# Patient Record
Sex: Female | Born: 1970 | Race: White | Hispanic: No | Smoking: Current every day smoker
Health system: Southern US, Community
[De-identification: ages and names within clinical notes are randomized; demographics above are authoritative.]

## PROBLEM LIST (undated history)

## (undated) DIAGNOSIS — I10 Essential (primary) hypertension: Secondary | ICD-10-CM

## (undated) DIAGNOSIS — M1990 Unspecified osteoarthritis, unspecified site: Secondary | ICD-10-CM

## (undated) DIAGNOSIS — R002 Palpitations: Secondary | ICD-10-CM

## (undated) DIAGNOSIS — J449 Chronic obstructive pulmonary disease, unspecified: Secondary | ICD-10-CM

## (undated) DIAGNOSIS — F419 Anxiety disorder, unspecified: Secondary | ICD-10-CM

## (undated) DIAGNOSIS — T7840XA Allergy, unspecified, initial encounter: Secondary | ICD-10-CM

## (undated) DIAGNOSIS — C649 Malignant neoplasm of unspecified kidney, except renal pelvis: Secondary | ICD-10-CM

## (undated) DIAGNOSIS — R000 Tachycardia, unspecified: Secondary | ICD-10-CM

## (undated) DIAGNOSIS — E785 Hyperlipidemia, unspecified: Secondary | ICD-10-CM

## (undated) DIAGNOSIS — G4730 Sleep apnea, unspecified: Secondary | ICD-10-CM

## (undated) HISTORY — DX: Tachycardia, unspecified: R00.0

## (undated) HISTORY — PX: PARTIAL NEPHRECTOMY: SHX414

## (undated) HISTORY — PX: WRIST FRACTURE SURGERY: SHX121

## (undated) HISTORY — DX: Essential (primary) hypertension: I10

## (undated) HISTORY — DX: Hyperlipidemia, unspecified: E78.5

## (undated) HISTORY — DX: Malignant neoplasm of unspecified kidney, except renal pelvis: C64.9

## (undated) HISTORY — DX: Unspecified osteoarthritis, unspecified site: M19.90

## (undated) HISTORY — PX: COLONOSCOPY: SHX174

## (undated) HISTORY — DX: Anxiety disorder, unspecified: F41.9

## (undated) HISTORY — DX: Sleep apnea, unspecified: G47.30

## (undated) HISTORY — DX: Allergy, unspecified, initial encounter: T78.40XA

## (undated) HISTORY — DX: Palpitations: R00.2

## (undated) HISTORY — DX: Chronic obstructive pulmonary disease, unspecified: J44.9

---

## 2010-06-01 DIAGNOSIS — I10 Essential (primary) hypertension: Secondary | ICD-10-CM | POA: Insufficient documentation

## 2010-06-01 DIAGNOSIS — F17200 Nicotine dependence, unspecified, uncomplicated: Secondary | ICD-10-CM | POA: Insufficient documentation

## 2010-06-15 DIAGNOSIS — R87619 Unspecified abnormal cytological findings in specimens from cervix uteri: Secondary | ICD-10-CM | POA: Insufficient documentation

## 2011-02-15 DIAGNOSIS — M19049 Primary osteoarthritis, unspecified hand: Secondary | ICD-10-CM | POA: Insufficient documentation

## 2012-07-16 DIAGNOSIS — M545 Low back pain: Secondary | ICD-10-CM | POA: Insufficient documentation

## 2012-07-16 DIAGNOSIS — M542 Cervicalgia: Secondary | ICD-10-CM | POA: Insufficient documentation

## 2013-03-05 DIAGNOSIS — Z7189 Other specified counseling: Secondary | ICD-10-CM | POA: Insufficient documentation

## 2013-03-05 DIAGNOSIS — Z7185 Encounter for immunization safety counseling: Secondary | ICD-10-CM | POA: Insufficient documentation

## 2013-03-05 DIAGNOSIS — M25561 Pain in right knee: Secondary | ICD-10-CM | POA: Insufficient documentation

## 2013-04-14 DIAGNOSIS — Z1331 Encounter for screening for depression: Secondary | ICD-10-CM | POA: Insufficient documentation

## 2013-06-22 DIAGNOSIS — K599 Functional intestinal disorder, unspecified: Secondary | ICD-10-CM | POA: Insufficient documentation

## 2015-01-31 DIAGNOSIS — Z2821 Immunization not carried out because of patient refusal: Secondary | ICD-10-CM | POA: Insufficient documentation

## 2015-02-20 DIAGNOSIS — N939 Abnormal uterine and vaginal bleeding, unspecified: Secondary | ICD-10-CM | POA: Insufficient documentation

## 2015-02-20 DIAGNOSIS — N841 Polyp of cervix uteri: Secondary | ICD-10-CM | POA: Insufficient documentation

## 2016-09-09 ENCOUNTER — Other Ambulatory Visit: Payer: Self-pay | Admitting: Orthopedic Surgery

## 2016-09-09 DIAGNOSIS — G8929 Other chronic pain: Secondary | ICD-10-CM

## 2016-09-09 DIAGNOSIS — M25561 Pain in right knee: Secondary | ICD-10-CM

## 2016-09-09 DIAGNOSIS — M1710 Unilateral primary osteoarthritis, unspecified knee: Secondary | ICD-10-CM

## 2016-09-18 ENCOUNTER — Encounter: Payer: Self-pay | Admitting: Radiology

## 2016-09-18 ENCOUNTER — Ambulatory Visit
Admission: RE | Admit: 2016-09-18 | Discharge: 2016-09-18 | Disposition: A | Discharge status: Home / Self Care | Source: Ambulatory Visit | Attending: Orthopedic Surgery | Admitting: Orthopedic Surgery

## 2016-09-18 DIAGNOSIS — M1710 Unilateral primary osteoarthritis, unspecified knee: Secondary | ICD-10-CM | POA: Diagnosis present

## 2016-09-18 DIAGNOSIS — M25561 Pain in right knee: Secondary | ICD-10-CM | POA: Diagnosis not present

## 2016-09-18 DIAGNOSIS — G8929 Other chronic pain: Secondary | ICD-10-CM | POA: Diagnosis not present

## 2016-09-18 DIAGNOSIS — M25461 Effusion, right knee: Secondary | ICD-10-CM | POA: Insufficient documentation

## 2016-09-18 DIAGNOSIS — M1711 Unilateral primary osteoarthritis, right knee: Secondary | ICD-10-CM | POA: Insufficient documentation

## 2016-09-18 DIAGNOSIS — S83241A Other tear of medial meniscus, current injury, right knee, initial encounter: Secondary | ICD-10-CM | POA: Diagnosis not present

## 2016-09-18 DIAGNOSIS — X58XXXA Exposure to other specified factors, initial encounter: Secondary | ICD-10-CM | POA: Diagnosis not present

## 2016-09-27 DIAGNOSIS — R9439 Abnormal result of other cardiovascular function study: Secondary | ICD-10-CM | POA: Insufficient documentation

## 2017-04-13 DIAGNOSIS — R002 Palpitations: Secondary | ICD-10-CM | POA: Insufficient documentation

## 2017-04-27 DIAGNOSIS — G4733 Obstructive sleep apnea (adult) (pediatric): Secondary | ICD-10-CM | POA: Insufficient documentation

## 2017-05-16 ENCOUNTER — Telehealth: Payer: Self-pay | Admitting: *Deleted

## 2017-05-16 NOTE — Telephone Encounter (Signed)
Noted  

## 2017-05-16 NOTE — Telephone Encounter (Signed)
Sheila Walker, please review heart hx. She is for recall colon with Dr.Gupta on 05/29/17. In her chart patient has had "abnormal myocardial perfusion study" 09/12/16, then cardiac cath done 10/10/16, EF 60-65%. [place] for colon at [facility]? Please advise. Thank you, Robbin PV

## 2017-05-16 NOTE — Telephone Encounter (Signed)
Robbin,  This pt is cleared for anesthetic care at [facility].  Thanks,

## 2017-05-19 ENCOUNTER — Other Ambulatory Visit: Payer: Self-pay

## 2017-05-19 ENCOUNTER — Ambulatory Visit (AMBULATORY_SURGERY_CENTER): Payer: Self-pay | Admitting: *Deleted

## 2017-05-19 VITALS — Ht 63.0 in | Wt 217.0 lb

## 2017-05-19 DIAGNOSIS — Z86010 Personal history of colonic polyps: Secondary | ICD-10-CM

## 2017-05-19 MED ORDER — SOD PICOSULFATE-MAG OX-CIT ACD 10-3.5-12 MG-GM -GM/160ML PO SOLN: 1.0000 | Freq: Once | ORAL | 0 refills | Status: AC

## 2017-05-19 NOTE — Progress Notes (Signed)
Patient denies any allergies to eggs or soy. Patient denies any problems with anesthesia/sedation. Patient denies any oxygen use at home. Patient denies taking any diet/weight loss medications or blood thinners. EMMI education assisgned to patient on colonoscopy, this was explained and instructions given to patient. Clenpiq sample given to pt.

## 2017-05-29 ENCOUNTER — Encounter: Payer: Self-pay | Admitting: Gastroenterology

## 2017-05-29 ENCOUNTER — Ambulatory Visit (AMBULATORY_SURGERY_CENTER): Admitting: Gastroenterology

## 2017-05-29 ENCOUNTER — Other Ambulatory Visit: Payer: Self-pay

## 2017-05-29 VITALS — BP 126/87 | HR 75 | Temp 99.1°F | Resp 16 | Ht 63.0 in | Wt 217.0 lb

## 2017-05-29 DIAGNOSIS — K635 Polyp of colon: Secondary | ICD-10-CM | POA: Diagnosis not present

## 2017-05-29 DIAGNOSIS — Z86010 Personal history of colonic polyps: Secondary | ICD-10-CM

## 2017-05-29 DIAGNOSIS — D125 Benign neoplasm of sigmoid colon: Secondary | ICD-10-CM

## 2017-05-29 MED ORDER — SODIUM CHLORIDE 0.9 % IV SOLN: 500.0000 mL | Freq: Once | INTRAVENOUS | Status: DC

## 2017-05-29 NOTE — Progress Notes (Signed)
Pt. Reports no change in her medical or surgical history since her pre-visit 05/19/2017.

## 2017-05-29 NOTE — Progress Notes (Signed)
Called to room to assist during endoscopic procedure.  Patient ID and intended procedure confirmed with present staff. Received instructions for my participation in the procedure from the performing physician.  

## 2017-05-29 NOTE — Progress Notes (Signed)
A/ox3 pleased with MAC, report to Suzanne RN 

## 2017-05-29 NOTE — Patient Instructions (Signed)
YOU HAD AN ENDOSCOPIC PROCEDURE TODAY AT [facility]:   Refer to the procedure report that was given to you for any specific questions about what was found during the examination.  If the procedure report does not answer your questions, please call your gastroenterologist to clarify.  If you requested that your care partner not be given the details of your procedure findings, then the procedure report has been included in a sealed envelope for you to review at your convenience later.  YOU SHOULD EXPECT: Some feelings of bloating in the abdomen. Passage of more gas than usual.  Walking can help get rid of the air that was put into your GI tract during the procedure and reduce the bloating. If you had a lower endoscopy (such as a colonoscopy or flexible sigmoidoscopy) you may notice spotting of blood in your stool or on the toilet paper. If you underwent a bowel prep for your procedure, you may not have a normal bowel movement for a few days.  Please Note:  You might notice some irritation and congestion in your nose or some drainage.  This is from the oxygen used during your procedure.  There is no need for concern and it should clear up in a day or so.  SYMPTOMS TO REPORT IMMEDIATELY:   Following lower endoscopy (colonoscopy or flexible sigmoidoscopy):  Excessive amounts of blood in the stool  Significant tenderness or worsening of abdominal pains  Swelling of the abdomen that is new, acute  Fever of 100F or higher   For urgent or emergent issues, a gastroenterologist can be reached at any hour by calling [phone].  NO ASPIRIN NOR NSAIDS: ALEVE NOR IBUPROFEN FOR 5 DAYS OST PROCEDURE.  DIET:  We do recommend a small meal at first, but then you may proceed to your regular diet.  Drink plenty of fluids but you should avoid alcoholic beverages for 24 hours. Try to increase the fiber in your diet, and drink plenty of water.  ACTIVITY:  You should plan to take it easy for the  rest of today and you should NOT DRIVE or use heavy machinery until tomorrow (because of the sedation medicines used during the test).    FOLLOW UP: Our staff will call the number listed on your records the next business day following your procedure to check on you and address any questions or concerns that you may have regarding the information given to you following your procedure. If we do not reach you, we will leave a message.  However, if you are feeling well and you are not experiencing any problems, there is no need to return our call.  We will assume that you have returned to your regular daily activities without incident.  If any biopsies were taken you will be contacted by phone or by letter within the next 1-3 weeks.  Please call us at [phone] if you have not heard about the biopsies in 3 weeks.    SIGNATURES/CONFIDENTIALITY: You and/or your care partner have signed paperwork which will be entered into your electronic medical record.  These signatures attest to the fact that that the information above on your After Visit Summary has been reviewed and is understood.  Full responsibility of the confidentiality of this discharge information lies with you and/or your care-partner.  Read all of the handouts given to you by your recovery room nurse.

## 2017-05-29 NOTE — Op Note (Addendum)
[facility] Patient Name: Sheila Walker Procedure Date: 05/29/2017 11:08 AM MRN: [id number] Endoscopist: Jackquline Denmark MD, MD Age: 47 Referring MD:  Date of Birth: [birth year] Gender: Female Account #: [id number] Procedure:                Colonoscopy Indications:              High risk colon cancer surveillance: Personal                            history of multiple (3 or more) adenomas Medicines:                Monitored Anesthesia Care Procedure:                Pre-Anesthesia Assessment:                           - ASA Grade Assessment: II - A patient with mild                            systemic disease.                           - The anesthesia plan was to use monitored                            anesthesia care (MAC).                           After obtaining informed consent, the colonoscope                            was passed under direct vision. Throughout the                            procedure, the patient's blood pressure, pulse, and                            oxygen saturations were monitored continuously. The                            Model PCF-[id number]) scope was introduced                            through the anus and advanced to the the terminal                            ileum, with identification of the appendiceal                            orifice and IC valve. The colonoscopy was performed                            without difficulty. The patient tolerated the  procedure well. The quality of the bowel                            preparation was good. Anatomical landmarks were                            photographed. Scope In: 11:58:10 AM Scope Out: 12:15:10 PM Scope Withdrawal Time: 0 hours 14 minutes 2 seconds  Total Procedure Duration: 0 hours 17 minutes 0 seconds  Findings:                 The digital rectal exam findings include internal                            hemorrhoids that prolapse with  straining, but                            spontaneously regress to the resting position                            (Grade II).                           A 10 mm polyp was found in the distal sigmoid colon                            25 cm from the anal verge. The polyp was                            semi-pedunculated. The polyp was removed with a hot                            snare. Resection and retrieval were complete.                            Verification of specimen was done. Estimated blood                            loss: none.                           Multiple small-mouthed diverticula were found in                            the sigmoid colon, descending colon and ascending                            colon, more prominently in the sigmoid colon. No                            endoscopic evidence of diverticulitis. Complications:            No immediate complications. Estimated Blood Loss:     Estimated blood loss: none. Impression:               - Sigmoid polyp s/p polypectomy.                           -  Pancolonic diverticulosis predominanty in the                            sigmoid colon.                           - Internal hemorrhoids Recommendation:           - Discharge patient to home.                           - Patient has a contact number available for                            emergencies. The signs and symptoms of potential                            delayed complications were discussed with the                            patient. Return to normal activities tomorrow.                            Written discharge instructions were provided to the                            patient.                           - High fiber diet.                           - Continue present medications.                           - Await pathology results.                           - No aspirin, ibuprofen, naproxen, or other                            non-steroidal anti-inflammatory  drugs for 5 days                            after polyp removal.                           - If any problems with hemorroids, use prep H and                            get in touch with [place]. Jackquline Denmark MD, MD 05/29/2017 12:38:01 PM This report has been signed electronically.

## 2017-05-30 ENCOUNTER — Telehealth: Payer: Self-pay | Admitting: *Deleted

## 2017-05-30 NOTE — Telephone Encounter (Signed)
  Follow up Call-  Call back number 05/29/2017  Post procedure Call Back phone  # [phone]  Permission to leave phone message Yes     Patient questions:  Do you have a fever, pain , or abdominal swelling? No. Pain Score  0 *  Have you tolerated food without any problems? Yes.    Have you been able to return to your normal activities? Yes.    Do you have any questions about your discharge instructions: Diet   No. Medications  No. Follow up visit  No.  Do you have questions or concerns about your Care? No.  Actions: * If pain score is 4 or above: No action needed, pain <4.    FYI...  Pt. States she has seen a small amount of blood from rectal area.  She states she strained to pass gas and noted a larger amount of bright red blood.  This did not continue to flow.  I advised her to watch the  toliet and let us know if bleeding becomes heavy.  Otherwise, she states she feels well.

## 2017-05-30 NOTE — Telephone Encounter (Signed)
No answer, left message to call if questions or concerns. 

## 2017-06-01 NOTE — Telephone Encounter (Signed)
Thanks for letting me know, Opal Sidles. Can you please call her on Monday again to make sure that she did not have any further bleeding. Thanks.  Merrie Roof

## 2017-06-19 ENCOUNTER — Encounter: Payer: Self-pay | Admitting: Gastroenterology

## 2017-06-19 ENCOUNTER — Telehealth: Payer: Self-pay | Admitting: *Deleted

## 2017-06-19 NOTE — Telephone Encounter (Signed)
This telephone call was answered in error. SM

## 2019-08-17 ENCOUNTER — Telehealth: Payer: Self-pay

## 2019-08-17 NOTE — Telephone Encounter (Signed)
NOTES ON FILE FROM [facility] [phone], SENT REFERRAL TO SCHEDULING 

## 2019-08-18 ENCOUNTER — Other Ambulatory Visit: Payer: Self-pay | Admitting: Family Medicine

## 2019-08-18 DIAGNOSIS — Z1231 Encounter for screening mammogram for malignant neoplasm of breast: Secondary | ICD-10-CM

## 2019-09-02 ENCOUNTER — Ambulatory Visit
Admission: RE | Admit: 2019-09-02 | Discharge: 2019-09-02 | Disposition: A | Discharge status: Home / Self Care | Source: Ambulatory Visit | Attending: Family Medicine | Admitting: Family Medicine

## 2019-09-02 ENCOUNTER — Other Ambulatory Visit: Payer: Self-pay

## 2019-09-02 DIAGNOSIS — Z1231 Encounter for screening mammogram for malignant neoplasm of breast: Secondary | ICD-10-CM

## 2019-09-08 ENCOUNTER — Encounter: Payer: Self-pay | Admitting: Cardiology

## 2019-09-08 DIAGNOSIS — E785 Hyperlipidemia, unspecified: Secondary | ICD-10-CM | POA: Insufficient documentation

## 2019-09-08 DIAGNOSIS — I159 Secondary hypertension, unspecified: Secondary | ICD-10-CM | POA: Insufficient documentation

## 2019-09-08 NOTE — Progress Notes (Signed)
Cardiology Office Note   Date:  09/09/2019 any other records on home new patient  ID:  Sheila Walker, DOB 02/08/1971, MRN [id number]  PCP:  [facility]  Cardiologist:   No primary care provider on file. Referring:  Dolleschel, Raquel Sarna, FNP  Chief Complaint  Patient presents with  . Palpitations      History of Present Illness: Sheila Walker is a 49 y.o. female who  Is referred by Alvera Singh, FNP for of palpitations.  The patient has had a cardiac work-up.  She had chest pain and in 2018 she had a cardiac cath.  I was able to review these results.  Her LAD had 40% stenosis but there was no other disease elsewhere.  She did not have a left ventriculogram.  Most recently she has been evaluated for palpitations.  There was an event monitor that she wore in October.  This demonstrated 16 beat runs of SVT at the longest and some isolated nonsustained V. tach with the longest being 7 beats.  She seems to feel these.  She feels palpitations.  She says at night she feels a strong heartbeat and she has to meditate to get that stop so she can go to sleep.  This is different than the palpitations that she describes when she was wearing a monitor.  She has not had any syncope associated with these.  They occur unprovoked.  However, as she has gotten more active working and lost some weight she is much less bothered by these.  She has some fleeting chest discomfort but actually this went away when she saw a chiropractor.  She is not describing substernal chest discomfort.  She is not having any new shortness of breath, PND orthopnea.  She has not had any weight gain or edema.      Patient had a min HR of 51 bpm, max HR of 218 bpm, and avg HR of 94 bpm. Predominant underlying rhythm was Sinus Rhythm.   -2 Ventricular Tachycardia runs occurred, the run with the fastest interval lasting 4 beats with a max rate of 218 bpm, the longest lasting 7 beats with an avg rate of 184 bpm.   -16  Supraventricular Tachycardia runs occurred, the run with the fastest interval lasting 7.3 secs with a max rate of 187 bpm, the longest lasting 17 beats with an avg rate of 120 bpm.   -Second Degree AV Block-Mobitz I (Wenckebach) was present.   -Isolated SVEs were rare (<1.0%, 1979), SVE Couplets were rare (<1.0%, 68), and SVE Triplets were rare (<1.0%, 6).   -Isolated VEs were rare (<1.0%, 1971), VE Couplets were rare (<1.0%, 17), and VE Triplets were rare (<1.0%, 3). Ventricular Bigeminy and Trigeminy were present.   Past Medical History:  Diagnosis Date  . Allergy   . Anxiety   . Arthritis   . Cancer of kidney (Sheila Walker)   . Hyperlipidemia   . Hypertension   . Palpitations   . Sleep apnea    CPAP    Past Surgical History:  Procedure Laterality Date  . COLONOSCOPY  3 years ago    W/Dr.Gupta  . PARTIAL NEPHRECTOMY     Left  . WRIST FRACTURE SURGERY       Current Outpatient Medications  Medication Sig Dispense Refill  . aspirin EC 81 MG tablet Take 1 tablet by mouth daily.    . hydrochlorothiazide (HYDRODIURIL) 25 MG tablet Take 1 tablet by mouth daily.    . Melatonin 3 MG TBDP  Take 1 tablet by mouth daily.    . metoprolol succinate (TOPROL-XL) 25 MG 24 hr tablet Take 1 tablet by mouth daily.    . nicotine (NICODERM CQ - DOSED IN MG/24 HOURS) 14 mg/24hr patch Place onto the skin.    Marland Kitchen NIFEdipine (PROCARDIA-XL/NIFEDICAL-XL) 30 MG 24 hr tablet Take by mouth.    . polyethylene glycol powder (GLYCOLAX/MIRALAX) 17 GM/SCOOP powder polyethylene glycol 3350 17 gram/dose oral powder  MIX AND DRINK  17 GRAMS PO DAILY UTD    . Prenatal Vit-Fe Fumarate-FA (PNV PRENATAL PLUS MULTIVITAMIN) 27-1 MG TABS Take 1 tablet by mouth daily.    . traMADol (ULTRAM) 50 MG tablet Take 50 mg by mouth every 6 (six) hours as needed.    Marland Kitchen atorvastatin (LIPITOR) 10 MG tablet Take 1 tablet (10 mg total) by mouth daily. 90 tablet 1   Current Facility-Administered Medications  Medication Dose Route Frequency  Provider Last Rate Last Admin  . 0.9 %  sodium chloride infusion  500 mL Intravenous Once Jackquline Denmark, MD        Allergies:   Amlodipine, Azithromycin, Lisinopril, Isosorbide nitrate, and Etodolac    Social History:  The patient  reports that she has been smoking cigarettes. She has been smoking about 0.75 packs per day. She has never used smokeless tobacco. She reports current drug use. Drug: Marijuana. She reports that she does not drink alcohol.   Family History:  The patient's family history includes Colon cancer (age of onset: 55) in her maternal grandfather and maternal uncle.    ROS:  Please see the history of present illness.   Otherwise, review of systems are positive for none.   All other systems are reviewed and negative.    PHYSICAL EXAM: VS:  BP 130/82   Pulse 94   Ht 5\' 7"  (1.702 m)   Wt 196 lb 9.6 oz (89.2 kg)   SpO2 98%   BMI 30.79 kg/m  , BMI Body mass index is 30.79 kg/m. GENERAL:  Well appearing HEENT:  Pupils equal round and reactive, fundi not visualized, oral mucosa unremarkable NECK:  No jugular venous distention, waveform within normal limits, carotid upstroke brisk and symmetric, no bruits, no thyromegaly LYMPHATICS:  No cervical, inguinal adenopathy LUNGS:  Clear to auscultation bilaterally BACK:  No CVA tenderness CHEST:  Unremarkable HEART:  PMI not displaced or sustained,S1 and S2 within normal limits, no S3, no S4, no clicks, no rubs, no murmurs ABD:  Flat, positive bowel sounds normal in frequency in pitch, no bruits, no rebound, no guarding, no midline pulsatile mass, no hepatomegaly, no splenomegaly EXT:  2 plus pulses throughout, no edema, no cyanosis no clubbing SKIN:  No rashes no nodules NEURO:  Cranial nerves II through XII grossly intact, motor grossly intact throughout PSYCH:  Cognitively intact, oriented to person place and time    EKG:  EKG is ordered today. The ekg ordered today demonstrates sinus rhythm, rate 94, axis within  normal limits, intervals within normal limits, no acute ST-T wave changes.   Recent Labs: No results found for requested labs within last 8760 hours.    Lipid Panel No results found for: CHOL, TRIG, HDL, CHOLHDL, VLDL, LDLCALC, LDLDIRECT    Wt Readings from Last 3 Encounters:  09/09/19 196 lb 9.6 oz (89.2 kg)  05/29/17 217 lb (98.4 kg)  05/19/17 217 lb (98.4 kg)      Other studies Reviewed: Additional studies/ records that were reviewed today include: Care Everywhere . Review of the above  records demonstrates:  Please see elsewhere in the note.     ASSESSMENT AND PLAN:  PALPITATIONS:   She is having premature ventricular contractions and premature atrial contractions.  She is not taking her beta-blocker as previously prescribed.  I suggest that she start to take the beta-blocker.  I do notice that she had a normal TSH and basic metabolic profile recently.  If she is having continued palpitations when she starts to take her beta-blocker as prescribed we will consider further addressing this.  She does not need further imaging.   HTN: Her blood pressure is controlled.  She will continue the meds as listed.  CAD: She had nonobstructive coronary disease in 2018.  We talked about this in detail.  She needs risk reduction.  No further testing is suggested.  Her chest pain is atypical.  DYSLIPIDEMIA: She does have nonobstructive coronary disease.  I think her goal LDL is less than 70.  She has not been taking her statin.  She will start this and I will repeat a lipid profile when she returns.  TOBACCO ABUSE:   We talked about the need to stop smoking.  She is going to continue to work on this.  COVID EDUCATION: She has had a Covid vaccine.  Current medicines are reviewed at length with the patient today.  The patient does not have concerns regarding medicines.  The following changes have been made:  no change  Labs/ tests ordered today include:   Orders Placed This Encounter    Procedures  . Lipid panel  . Comprehensive metabolic panel  . EKG 12-Lead     Disposition:   FU with me in 3 months or sooner.   Signed, Minus Breeding, MD  09/09/2019 10:33 AM    [facility]

## 2019-09-09 ENCOUNTER — Encounter: Payer: Self-pay | Admitting: Cardiology

## 2019-09-09 ENCOUNTER — Other Ambulatory Visit: Payer: Self-pay

## 2019-09-09 ENCOUNTER — Ambulatory Visit (INDEPENDENT_AMBULATORY_CARE_PROVIDER_SITE_OTHER): Admitting: Cardiology

## 2019-09-09 VITALS — BP 130/82 | HR 94 | Ht 67.0 in | Wt 196.6 lb

## 2019-09-09 DIAGNOSIS — E785 Hyperlipidemia, unspecified: Secondary | ICD-10-CM

## 2019-09-09 DIAGNOSIS — Z7189 Other specified counseling: Secondary | ICD-10-CM

## 2019-09-09 DIAGNOSIS — I159 Secondary hypertension, unspecified: Secondary | ICD-10-CM | POA: Diagnosis not present

## 2019-09-09 DIAGNOSIS — Z5181 Encounter for therapeutic drug level monitoring: Secondary | ICD-10-CM

## 2019-09-09 DIAGNOSIS — R002 Palpitations: Secondary | ICD-10-CM

## 2019-09-09 MED ORDER — ATORVASTATIN CALCIUM 10 MG PO TABS: 10.0000 mg | ORAL_TABLET | Freq: Every day | ORAL | 1 refills | Status: DC

## 2019-09-09 NOTE — Patient Instructions (Addendum)
Medication Instructions:  START ATORVASTATIN 10 MG DAILY AS PRESCRIBED   *If you need a refill on your cardiac medications before your next appointment, please call your pharmacy*  Lab Work: FASTING LP/CMET FEW DAYS PRIOR TO FOLLOW UP VISIT   Testing/Procedures: NONE   Follow-Up: At [facility], you and your health needs are our priority.  As part of our continuing mission to provide you with exceptional heart care, we have created designated Provider Care Teams.  These Care Teams include your primary Cardiologist (physician) and Advanced Practice Providers (APPs -  Physician Assistants and Nurse Practitioners) who all work together to provide you with the care you need, when you need it.  We recommend signing up for the patient portal called "MyChart".  Sign up information is provided on this After Visit Summary.  MyChart is used to connect with patients for Virtual Visits (Telemedicine).  Patients are able to view lab/test results, encounter notes, upcoming appointments, etc.  Non-urgent messages can be sent to your provider as well.   To learn more about what you can do with MyChart, go to [email or web address].    Your next appointment:   3 month(s)  The format for your next appointment:   In Person  Provider:   You may see DR [facility]  or one of the following Advanced Practice Providers on your designated Care Team:    Rosaria Ferries, PA-C  Jory Sims, DNP, ANP  Cadence Kathlen Mody, NP

## 2019-10-03 ENCOUNTER — Encounter: Payer: Self-pay | Admitting: Emergency Medicine

## 2019-10-03 ENCOUNTER — Other Ambulatory Visit: Payer: Self-pay

## 2019-10-03 ENCOUNTER — Emergency Department
Admission: EM | Admit: 2019-10-03 | Discharge: 2019-10-03 | Disposition: A | Discharge status: Home / Self Care | Attending: Emergency Medicine | Admitting: Emergency Medicine

## 2019-10-03 ENCOUNTER — Emergency Department

## 2019-10-03 DIAGNOSIS — I10 Essential (primary) hypertension: Secondary | ICD-10-CM | POA: Diagnosis not present

## 2019-10-03 DIAGNOSIS — Z20822 Contact with and (suspected) exposure to covid-19: Secondary | ICD-10-CM | POA: Diagnosis not present

## 2019-10-03 DIAGNOSIS — J029 Acute pharyngitis, unspecified: Secondary | ICD-10-CM | POA: Insufficient documentation

## 2019-10-03 DIAGNOSIS — Z7982 Long term (current) use of aspirin: Secondary | ICD-10-CM | POA: Insufficient documentation

## 2019-10-03 DIAGNOSIS — Z79899 Other long term (current) drug therapy: Secondary | ICD-10-CM | POA: Diagnosis not present

## 2019-10-03 DIAGNOSIS — K122 Cellulitis and abscess of mouth: Secondary | ICD-10-CM | POA: Diagnosis not present

## 2019-10-03 DIAGNOSIS — F17210 Nicotine dependence, cigarettes, uncomplicated: Secondary | ICD-10-CM | POA: Insufficient documentation

## 2019-10-03 LAB — CBC WITH DIFFERENTIAL/PLATELET
Abs Immature Granulocytes: 0.1 10*3/uL — ABNORMAL HIGH (ref 0.00–0.07)
Basophils Absolute: 0.1 10*3/uL (ref 0.0–0.1)
Basophils Relative: 0 %
Eosinophils Absolute: 0.2 10*3/uL (ref 0.0–0.5)
Eosinophils Relative: 1 %
HCT: 40.3 % (ref 36.0–46.0)
Hemoglobin: 13.4 g/dL (ref 12.0–15.0)
Immature Granulocytes: 1 %
Lymphocytes Relative: 9 %
Lymphs Abs: 1.7 10*3/uL (ref 0.7–4.0)
MCH: 29.1 pg (ref 26.0–34.0)
MCHC: 33.3 g/dL (ref 30.0–36.0)
MCV: 87.4 fL (ref 80.0–100.0)
Monocytes Absolute: 1.2 10*3/uL — ABNORMAL HIGH (ref 0.1–1.0)
Monocytes Relative: 7 %
Neutro Abs: 15.1 10*3/uL — ABNORMAL HIGH (ref 1.7–7.7)
Neutrophils Relative %: 82 %
Platelets: 280 10*3/uL (ref 150–400)
RBC: 4.61 MIL/uL (ref 3.87–5.11)
RDW: 14.2 % (ref 11.5–15.5)
WBC: 18.4 10*3/uL — ABNORMAL HIGH (ref 4.0–10.5)
nRBC: 0 % (ref 0.0–0.2)

## 2019-10-03 LAB — BASIC METABOLIC PANEL
Anion gap: 12 (ref 5–15)
BUN: 12 mg/dL (ref 6–20)
CO2: 22 mmol/L (ref 22–32)
Calcium: 8.6 mg/dL — ABNORMAL LOW (ref 8.9–10.3)
Chloride: 102 mmol/L (ref 98–111)
Creatinine, Ser: 0.52 mg/dL (ref 0.44–1.00)
GFR calc Af Amer: >60 mL/min (ref >60)
GFR calc non Af Amer: >60 mL/min (ref >60)
Glucose, Bld: 87 mg/dL (ref 70–99)
Potassium: 3.6 mmol/L (ref 3.5–5.1)
Sodium: 136 mmol/L (ref 135–145)

## 2019-10-03 LAB — RESP PANEL BY RT PCR (RSV, FLU A&B, COVID)
Influenza A by PCR: NEGATIVE
Influenza B by PCR: NEGATIVE
Respiratory Syncytial Virus by PCR: NEGATIVE
SARS Coronavirus 2 by RT PCR: NEGATIVE

## 2019-10-03 LAB — GROUP A STREP BY PCR: Group A Strep by PCR: NOT DETECTED

## 2019-10-03 MED ORDER — IOHEXOL 300 MG/ML  SOLN
75.0000 mL | Freq: Once | INTRAMUSCULAR | Status: AC | PRN
  Administered 2019-10-03: 75 mL via INTRAVENOUS

## 2019-10-03 MED ORDER — SODIUM CHLORIDE 0.9 % IV BOLUS
1000.0000 mL | Freq: Once | INTRAVENOUS | Status: AC
  Administered 2019-10-03: 1000 mL via INTRAVENOUS

## 2019-10-03 MED ORDER — SODIUM CHLORIDE 0.9 % IV SOLN
3.0000 g | Freq: Once | INTRAVENOUS | Status: AC
  Administered 2019-10-03: 3 g via INTRAVENOUS
  Filled 2019-10-03: qty 8

## 2019-10-03 MED ORDER — METHYLPREDNISOLONE ACETATE 80 MG/ML IJ SUSP
80.0000 mg | Freq: Once | INTRAMUSCULAR | Status: AC
  Administered 2019-10-03: 80 mg via INTRAMUSCULAR
  Filled 2019-10-03: qty 1

## 2019-10-03 MED ORDER — PREDNISONE 10 MG PO TABS: ORAL_TABLET | ORAL | 0 refills | Status: AC

## 2019-10-03 MED ORDER — SODIUM CHLORIDE 0.9 % IV SOLN
2.0000 g | Freq: Once | INTRAVENOUS | Status: AC
  Administered 2019-10-03: 2 g via INTRAVENOUS
  Filled 2019-10-03: qty 20

## 2019-10-03 MED ORDER — METHYLPREDNISOLONE SODIUM SUCC 125 MG IJ SOLR
125.0000 mg | Freq: Once | INTRAMUSCULAR | Status: AC
  Administered 2019-10-03: 125 mg via INTRAMUSCULAR
  Filled 2019-10-03: qty 2

## 2019-10-03 MED ORDER — AMOXICILLIN-POT CLAVULANATE 875-125 MG PO TABS: 1.0000 | ORAL_TABLET | Freq: Two times a day (BID) | ORAL | 0 refills | Status: AC

## 2019-10-03 MED ORDER — ONDANSETRON 4 MG PO TBDP
4.0000 mg | ORAL_TABLET | Freq: Once | ORAL | Status: AC
  Administered 2019-10-03: 4 mg via ORAL
  Filled 2019-10-03: qty 1

## 2019-10-03 MED ORDER — MORPHINE SULFATE (PF) 4 MG/ML IV SOLN
4.0000 mg | Freq: Once | INTRAVENOUS | Status: AC
  Administered 2019-10-03: 4 mg via INTRAVENOUS
  Filled 2019-10-03: qty 1

## 2019-10-03 NOTE — ED Notes (Signed)
Pt c/o of left sided throat pain w/ swallowing. Pt states she can breath fine w/ her mouth open. Pt was seen at [facility] today and referred to this ED. Pt has allergies and grass was cut yesterday afternoon w/ pain in throat starting in evening.

## 2019-10-03 NOTE — ED Triage Notes (Signed)
Pt arrives from home via [facility] via [facility]. Pt had allergic reaction at home following being outside while grass was being cut. Pt went to UC to be seen because throat was sore and developed into difficulty swallowing.

## 2019-10-03 NOTE — ED Provider Notes (Signed)
[facility] Emergency Department Provider Note  ____________________________________________  Time seen: Approximately 12:27 PM  I have reviewed the triage vital signs and the nursing notes.   HISTORY  Chief Complaint Allergic Reaction    HPI Sheila Walker is a 49 y.o. female presenting to the emergency department for treatment and evaluation of sore throat.  Patient states that the symptoms started yesterday evening after the grass was cut.  She has had some sensitivity to grass and pollen in the past without any previous symptoms similar to today.  She states that overnight her throat has continued to become more sore and she has had increasing difficulty swallowing.  She denies rhinorrhea or drainage from her eyes.  She denies rash or hives.  No alleviating measures attempted prior to arrival.   Past Medical History:  Diagnosis Date   Allergy    Anxiety    Arthritis    Cancer of kidney ([place])    Hyperlipidemia    Hypertension    Palpitations    Sleep apnea    CPAP    Patient Active Problem List   Diagnosis Date Noted   Dyslipidemia 09/08/2019   Secondary hypertension 09/08/2019   OSA (obstructive sleep apnea) 04/27/2017   Palpitations 04/13/2017   Abnormal stress test 09/27/2016   Abnormal uterine bleeding 02/20/2015   Cervical polyp 02/20/2015   Influenza vaccination declined 01/31/2015   Bowel dysfunction 06/22/2013   Depression screening 04/14/2013   Immunization counseling 03/05/2013   Right knee pain 03/05/2013   Low back pain 07/16/2012   Neck pain 07/16/2012   Primary localized osteoarthrosis of hand 02/15/2011   Abnormal glandular Papanicolaou smear of cervix 06/15/2010   Hypertension, benign 06/01/2010   Tobacco use disorder 06/01/2010    Past Surgical History:  Procedure Laterality Date   COLONOSCOPY  3 years ago    W/Dr.Gupta   PARTIAL NEPHRECTOMY     Left   WRIST FRACTURE SURGERY       Prior to Admission medications   Medication Sig Start Date End Date Taking? Authorizing Provider  amoxicillin-clavulanate (AUGMENTIN) 875-125 MG tablet Take 1 tablet by mouth 2 (two) times daily for 14 days. 10/03/19 10/17/19  Lilly Gasser, Johnette Abraham B, FNP  aspirin EC 81 MG tablet Take 1 tablet by mouth daily. 10/15/15   [provider]  atorvastatin (LIPITOR) 10 MG tablet Take 1 tablet (10 mg total) by mouth daily. 09/09/19 09/08/20  Minus Breeding, MD  hydrochlorothiazide (HYDRODIURIL) 25 MG tablet Take 1 tablet by mouth daily. 05/20/16   [provider]  Melatonin 3 MG TBDP Take 1 tablet by mouth daily. 08/14/16   [provider]  metoprolol succinate (TOPROL-XL) 25 MG 24 hr tablet Take 1 tablet by mouth daily. 02/03/17   [provider]  nicotine (NICODERM CQ - DOSED IN MG/24 HOURS) 14 mg/24hr patch Place onto the skin. 08/11/19   [provider]  NIFEdipine (PROCARDIA-XL/NIFEDICAL-XL) 30 MG 24 hr tablet Take by mouth. 07/30/19   [provider]  polyethylene glycol powder (GLYCOLAX/MIRALAX) 17 GM/SCOOP powder polyethylene glycol 3350 17 gram/dose oral powder  MIX AND DRINK  17 GRAMS PO DAILY UTD 08/26/17   [provider]  predniSONE (DELTASONE) 10 MG tablet Take 6 tablets (60 mg total) by mouth daily for 2 days, THEN 5 tablets (50 mg total) daily for 2 days, THEN 4 tablets (40 mg total) daily for 2 days, THEN 3 tablets (30 mg total) daily for 2 days, THEN 2 tablets (20 mg total) daily for  2 days, THEN 1 tablet (10 mg total) daily for 2 days. 10/03/19 10/15/19  Emmerich Cryer, Dessa Phi, FNP  Prenatal Vit-Fe Fumarate-FA (PNV PRENATAL PLUS MULTIVITAMIN) 27-1 MG TABS Take 1 tablet by mouth daily.    [provider]  traMADol (ULTRAM) 50 MG tablet Take 50 mg by mouth every 6 (six) hours as needed. 09/04/19   [provider]    Allergies Amlodipine, Azithromycin, Lisinopril, Isosorbide nitrate, and Etodolac  Family History  Problem Relation Age  of Onset   Colon cancer Maternal Uncle 60   Colon cancer Maternal Grandfather 60   Esophageal cancer Neg Hx    Stomach cancer Neg Hx     Social History Social History   Tobacco Use   Smoking status: Current Every Day Smoker    Packs/day: 0.75    Types: Cigarettes   Smokeless tobacco: Never Used  Vaping Use   Vaping Use: Never used  Substance Use Topics   Alcohol use: No   Drug use: Yes    Types: Marijuana    Comment: 1-2 times per week per pt    Review of Systems Constitutional: Negative for fever. Eyes: No visual changes. ENT: Positive for sore throat; negative for difficulty swallowing. Respiratory: Denies shortness of breath. Gastrointestinal: Negative for abdominal pain.  No nausea, no vomiting.  No diarrhea.  Genitourinary: Negative for dysuria.  Negative for decrease in need to void. Musculoskeletal: Negative for generalized body aches. Skin: Negative for rash. Neurological: Positive for headaches, negative for focal weakness or numbness.  ____________________________________________   PHYSICAL EXAM:  VITAL SIGNS: ED Triage Vitals  Enc Vitals Group     BP --      Pulse Rate 10/03/19 1223 104     Resp 10/03/19 1223 19     Temp 10/03/19 1223 98.8 F (37.1 C)     Temp Source 10/03/19 1223 Oral     SpO2 10/03/19 1223 98 %     Weight 10/03/19 1226 195 lb (88.5 kg)     Height 10/03/19 1226 5\' 7"  (1.702 m)     Head Circumference --      Peak Flow --      Pain Score 10/03/19 1223 10     Pain Loc --      Pain Edu? --      Excl. in [place]? --     Constitutional: Alert and oriented. Well appearing and in no acute distress. Eyes: Conjunctivae are normal.  Head: Atraumatic. Nose: No congestion/rhinnorhea. Mouth/Throat: Mucous membranes are moist.  Oropharynx erythematous, tonsils 2+ with exudate. Uvula is slightly shifted to the right. Ears: Right tympanic membrane appears normal.  Left tympanic membrane appears normal. Neck: No stridor. Voice  muffled Lymphatic: Anterior cervical nodes palpable and tender.  Greater on the left. Cardiovascular: Normal rate, regular rhythm. Good peripheral circulation. Respiratory: Normal respiratory effort. Lungs CTAB. Gastrointestinal: Soft and nontender. Musculoskeletal: FROM of neck, upper and lower extremities. Neurologic:  Normal speech and language. No gross focal neurologic deficits are appreciated. Skin:  Skin is warm, dry and intact.  No rash noted Psychiatric: Mood and affect are normal. Speech and behavior are normal.  ____________________________________________   LABS (all labs ordered are listed, but only abnormal results are displayed)  Labs Reviewed  CBC WITH DIFFERENTIAL/PLATELET - Abnormal; Notable for the following components:      Result Value   WBC 18.4 (*)    Neutro Abs 15.1 (*)    Monocytes Absolute 1.2 (*)    Abs Immature Granulocytes 0.10 (*)  All other components within normal limits  BASIC METABOLIC PANEL - Abnormal; Notable for the following components:   Calcium 8.6 (*)    All other components within normal limits  GROUP A STREP BY PCR  RESP PANEL BY RT PCR (RSV, FLU A&B, COVID)   ____________________________________________  EKG  Not indiated ____________________________________________  RADIOLOGY  Thickening and edema of the uvula and epiglottis.  Tonsillar prominence on the left more so than the right.  Regional inflammatory disease consistent with findings.  No drainable tonsillar peritonsillar abscess or deep space infection. ____________________________________________   PROCEDURES  Procedure(s) performed: None  Critical Care performed: No ____________________________________________   INITIAL IMPRESSION / ASSESSMENT AND PLAN / ED COURSE  49 year old female presenting to the emergency department for treatment and evaluation of sore throat after exposure to environmental allergens.  See HPI for further details.  Exam is more  concerning for a strep or peritonsillar abscess.  Plan will be to get some labs and then proceed with a CT soft tissue neck with contrast.  Also will obtain a strep swab.  Patient is aware of the plan and agrees.  2:19 PM: CT results reviewed.  She has some thickening and edema of the uvula and epiglottis.  There is tonsillar prominence of the left more so than the right consistent with regional inflammatory disease.  No drainable tonsillar peritonsillar abscess.  No evidence of deep space neck infection.  No adenopathy or notable separation.  Plan will be to discuss the findings with Dr. Tami Ribas. Requested secretary page for consult.  3:36pm: Dr. Tami Ribas consulted.  He was able to view the CT and recommends that in addition to meds already given to administer 2 g of Rocephin, 80 mg of Depo-Medrol and discharge her home with Augmentin and 12-day steroid taper.  He also recommended that she have a total of 2 L of IV fluids while here in the emergency department.  He will see her in follow-up outpatient in 1 week.  Plan discussed with the patient who is agreeable.  She will be discharged home after Rocephin has infused and second liter of fluids.  Pertinent labs & imaging results that were available during my care of the patient were reviewed by me and considered in my medical decision making (see chart for details). ____________________________________________  Discharge Medication List as of 10/03/2019  5:06 PM    START taking these medications   Details  amoxicillin-clavulanate (AUGMENTIN) 875-125 MG tablet Take 1 tablet by mouth 2 (two) times daily for 14 days., Starting Sun 10/03/2019, Until Sun 10/17/2019, Normal    predniSONE (DELTASONE) 10 MG tablet Multiple Dosages:Starting Sun 10/03/2019, Until Mon 10/04/2019 at 2359, THEN Starting Tue 10/05/2019, Until Wed 10/06/2019 at 2359, THEN Starting Thu 10/07/2019, Until Fri 10/08/2019 at 2359, THEN Starting Sat 10/09/2019, Until Sun 10/10/2019 at 2359, THEN Starting   Mon 10/11/2019, Until Tue 10/12/2019 at 2359, THEN Starting Wed 10/13/2019, Until Thu 10/14/2019 at 2359Take 6 tablets (60 mg total) by mouth daily for 2 days, THEN 5 tablets (50 mg total) daily for 2 days, THEN 4 tablets (40 mg total) daily for 2 days, TH EN 3 tablets (30 mg total) daily for 2 days, THEN 2 tablets (20 mg total) daily for 2 days, THEN 1 tablet (10 mg total) daily for 2 days., Normal        FINAL CLINICAL IMPRESSION(S) / ED DIAGNOSES  Final diagnoses:  Pharyngitis, unspecified etiology  Uvulitis    If controlled substance prescribed during this visit, 12 month  history viewed on the [place] prior to issuing an initial prescription for Schedule II or III opiod.   Note:  This document was prepared using Dragon voice recognition software and may include unintentional dictation errors.   Victorino Dike, FNP 10/03/19 1910    Lavonia Drafts, MD 10/03/19 [phone]

## 2019-10-03 NOTE — Discharge Instructions (Signed)
Take ibuprofen or tylenol as needed.  Take all of the prescribed medications as prescribed.  Follow up with ENT in 1 week.  Return to the ER for symptoms that change or worsen or for new concerns.

## 2019-10-04 ENCOUNTER — Encounter: Payer: Self-pay | Admitting: Emergency Medicine

## 2019-10-04 ENCOUNTER — Emergency Department
Admission: EM | Admit: 2019-10-04 | Discharge: 2019-10-04 | Disposition: A | Discharge status: Home / Self Care | Attending: Emergency Medicine | Admitting: Emergency Medicine

## 2019-10-04 ENCOUNTER — Other Ambulatory Visit: Payer: Self-pay

## 2019-10-04 DIAGNOSIS — Z7982 Long term (current) use of aspirin: Secondary | ICD-10-CM | POA: Diagnosis not present

## 2019-10-04 DIAGNOSIS — F17210 Nicotine dependence, cigarettes, uncomplicated: Secondary | ICD-10-CM | POA: Diagnosis not present

## 2019-10-04 DIAGNOSIS — Z79899 Other long term (current) drug therapy: Secondary | ICD-10-CM | POA: Insufficient documentation

## 2019-10-04 DIAGNOSIS — J029 Acute pharyngitis, unspecified: Secondary | ICD-10-CM | POA: Diagnosis present

## 2019-10-04 DIAGNOSIS — I10 Essential (primary) hypertension: Secondary | ICD-10-CM | POA: Diagnosis not present

## 2019-10-04 NOTE — ED Triage Notes (Signed)
Pt reports she was seen yesterday and given some medications for an allergic reaction. Pt c/o continued sore throat and needs an extended work note.

## 2019-10-04 NOTE — ED Notes (Signed)
See triage note  Presents with sore throat  Was seen yesterday  conts to have pain  And needs extra work note  No fever on arrival

## 2019-10-04 NOTE — ED Provider Notes (Signed)
[facility] Emergency Department Provider Note  ____________________________________________  Time seen: Approximately 3:57 PM  I have reviewed the triage vital signs and the nursing notes.   HISTORY  Chief Complaint work note and Sore Throat    HPI Sheila Walker is a 49 y.o. female that presents to the emergency department for a work note.  Patient was seen yesterday for a sore throat, given IV steroids and antibiotics.  She started her steroids and antibiotic prescription this afternoon.  She is feeling much better than she did yesterday.  Patient is eating and drinking normally.  She states that the swelling has significantly improved.  She has not had a fever.  She received a work note yesterday in the emergency department stating that she could return today.  She would like 1-2 more days off of work. She has groceries in the car that she needs to get back to.  Past Medical History:  Diagnosis Date   Allergy    Anxiety    Arthritis    Cancer of kidney ([place])    Hyperlipidemia    Hypertension    Palpitations    Sleep apnea    CPAP    Patient Active Problem List   Diagnosis Date Noted   Dyslipidemia 09/08/2019   Secondary hypertension 09/08/2019   OSA (obstructive sleep apnea) 04/27/2017   Palpitations 04/13/2017   Abnormal stress test 09/27/2016   Abnormal uterine bleeding 02/20/2015   Cervical polyp 02/20/2015   Influenza vaccination declined 01/31/2015   Bowel dysfunction 06/22/2013   Depression screening 04/14/2013   Immunization counseling 03/05/2013   Right knee pain 03/05/2013   Low back pain 07/16/2012   Neck pain 07/16/2012   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 Authorizing Provider  amoxicillin-clavulanate (AUGMENTIN) 875-125 MG tablet Take 1 tablet by mouth 2 (two) times daily for 14 days. 10/03/19 10/17/19  Triplett, Johnette Abraham B, FNP  aspirin EC 81 MG tablet Take 1 tablet by mouth daily. 10/15/15   [provider]  atorvastatin (LIPITOR) 10 MG tablet Take 1 tablet (10 mg total) by mouth daily. 09/09/19 09/08/20  Minus Breeding, MD  hydrochlorothiazide (HYDRODIURIL) 25 MG tablet Take 1 tablet by mouth daily. 05/20/16   [provider]  Melatonin 3 MG TBDP Take 1 tablet by mouth daily. 08/14/16   [provider]  metoprolol succinate (TOPROL-XL) 25 MG 24 hr tablet Take 1 tablet by mouth daily. 02/03/17   [provider]  nicotine (NICODERM CQ - DOSED IN MG/24 HOURS) 14 mg/24hr patch Place onto the skin. 08/11/19   [provider]  NIFEdipine (PROCARDIA-XL/NIFEDICAL-XL) 30 MG 24 hr tablet Take by mouth. 07/30/19   [provider]  polyethylene glycol powder (GLYCOLAX/MIRALAX) 17 GM/SCOOP powder polyethylene glycol 3350 17 gram/dose oral powder  MIX AND DRINK  17 GRAMS PO DAILY UTD 08/26/17   [provider]  predniSONE (DELTASONE) 10 MG tablet Take 6 tablets (60 mg total) by mouth daily for 2 days, THEN 5 tablets (50 mg total) daily for 2 days, THEN 4 tablets (40 mg total) daily for  2 days, THEN 3 tablets (30 mg total) daily for 2 days, THEN 2 tablets (20 mg total) daily for 2 days, THEN 1 tablet (10 mg total) daily for 2 days. 10/03/19 10/15/19  Triplett, Dessa Phi, FNP  Prenatal Vit-Fe Fumarate-FA (PNV PRENATAL PLUS MULTIVITAMIN) 27-1 MG TABS Take 1 tablet by mouth daily.    [provider]  traMADol (ULTRAM) 50 MG tablet Take 50 mg by mouth every 6 (six) hours as needed. 09/04/19   [provider]    Allergies Amlodipine, Azithromycin, Lisinopril, Isosorbide  nitrate, and Etodolac  Family History  Problem Relation Age of Onset   Colon cancer Maternal Uncle 60   Colon cancer Maternal Grandfather 60   Esophageal cancer Neg Hx    Stomach cancer Neg Hx     Social History Social History   Tobacco Use   Smoking status: Current Every Day Smoker    Packs/day: 0.75    Types: Cigarettes   Smokeless tobacco: Never Used  Vaping Use   Vaping Use: Never used  Substance Use Topics   Alcohol use: No   Drug use: Yes    Types: Marijuana    Comment: 1-2 times per week per pt     Review of Systems  Cardiovascular: No chest pain. Respiratory: No cough. No SOB. Gastrointestinal: No abdominal pain.  No nausea, no vomiting.  Musculoskeletal: Negative for musculoskeletal pain. Skin: Negative for rash, abrasions, lacerations, ecchymosis. Neurological: Negative for headaches   ____________________________________________   PHYSICAL EXAM:  VITAL SIGNS: ED Triage Vitals  Enc Vitals Group     BP 10/04/19 1414 (!) 163/94     Pulse Rate 10/04/19 1414 87     Resp 10/04/19 1414 20     Temp 10/04/19 1414 98.7 F (37.1 C)     Temp Source 10/04/19 1414 Oral     SpO2 10/04/19 1414 96 %     Weight 10/04/19 1412 193 lb (87.5 kg)     Height 10/04/19 1412 5\' 7"  (1.702 m)     Head Circumference --      Peak Flow --      Pain Score 10/04/19 1412 6     Pain Loc --      Pain Edu? --      Excl. in [place]? --      Constitutional: Alert and oriented. Well appearing and in no acute distress. Eyes: Conjunctivae are normal. PERRL. EOMI. Head: Atraumatic. ENT:      Ears:      Nose: No congestion/rhinnorhea.      Mouth/Throat: Mucous membranes are moist. Oropharynx mildly erythematous. No drooling. Neck: No stridor.  Cardiovascular: Normal rate, regular rhythm.  Good peripheral circulation. Respiratory: Normal respiratory effort without tachypnea or retractions. Lungs CTAB. Good air entry to the bases with no decreased or absent breath  sounds. Gastrointestinal: Bowel sounds 4 quadrants. Soft and nontender to palpation. No guarding or rigidity. No palpable masses. No distention.  Musculoskeletal: Full range of motion to all extremities. No gross deformities appreciated. Neurologic:  Normal speech and language. No gross focal neurologic deficits are appreciated.  Skin:  Skin is warm, dry and intact. No rash noted. Psychiatric: Mood and affect are normal. Speech and behavior are normal. Patient exhibits appropriate insight and judgement.   ____________________________________________   LABS (all labs ordered are listed, but only abnormal results are displayed)  Labs Reviewed - No data to display ____________________________________________  EKG   ____________________________________________  RADIOLOGY   No results found.  ____________________________________________  PROCEDURES  Procedure(s) performed:    Procedures    Medications - No data to display   ____________________________________________   INITIAL IMPRESSION / ASSESSMENT AND PLAN / ED COURSE  Pertinent labs & imaging results that were available during my care of the patient were reviewed by me and considered in my medical decision making (see chart for details).  Review of the [place] CSRS was performed in accordance of the [place] prior to dispensing any controlled drugs.   Patient presented to the emergency department for evaluation of a work note. She feels significantly improved from her visit yesterday. She is eating and drinking normally. She has no concerns today. Work note was provided. Patient is to follow up with ENT as directed. Patient is given ED precautions to return to the ED for any worsening or new symptoms.   Mollee Neer was evaluated in Emergency Department on 10/04/2019 for the symptoms described in the history of present illness. She was evaluated in the context of the global COVID-19 pandemic, which necessitated  consideration that the patient might be at risk for infection with the SARS-CoV-2 virus that causes COVID-19. Institutional protocols and algorithms that pertain to the evaluation of patients at risk for COVID-19 are in a state of rapid change based on information released by regulatory bodies including the CDC and federal and state organizations. These policies and algorithms were followed during the patient's care in the ED.  ____________________________________________  FINAL CLINICAL IMPRESSION(S) / ED DIAGNOSES  Final diagnoses:  Pharyngitis, unspecified etiology      NEW MEDICATIONS STARTED DURING THIS VISIT:  ED Discharge Orders    None          This chart was dictated using voice recognition software/Dragon. Despite best efforts to proofread, errors can occur which can change the meaning. Any change was purely unintentional.    Darlys, Buis, PA-C 10/04/19 2137    Lavonia Drafts, MD 10/04/19 2141

## 2019-10-13 ENCOUNTER — Other Ambulatory Visit
Admission: RE | Admit: 2019-10-13 | Discharge: 2019-10-13 | Disposition: A | Discharge status: Home / Self Care | Source: Ambulatory Visit | Attending: Unknown Physician Specialty | Admitting: Unknown Physician Specialty

## 2019-10-13 ENCOUNTER — Other Ambulatory Visit: Payer: Self-pay

## 2019-10-13 DIAGNOSIS — Z20822 Contact with and (suspected) exposure to covid-19: Secondary | ICD-10-CM | POA: Diagnosis not present

## 2019-10-13 DIAGNOSIS — Z01812 Encounter for preprocedural laboratory examination: Secondary | ICD-10-CM | POA: Diagnosis present

## 2019-10-13 LAB — SARS CORONAVIRUS 2 (TAT 6-24 HRS): SARS Coronavirus 2: NEGATIVE

## 2019-10-14 NOTE — Discharge Instructions (Signed)
General Anesthesia, Adult, Care After This sheet gives you information about how to care for yourself after your procedure. Your health care provider may also give you more specific instructions. If you have problems or questions, contact your health care provider. What can I expect after the procedure? After the procedure, the following side effects are common:  Pain or discomfort at the IV site.  Nausea.  Vomiting.  Sore throat.  Trouble concentrating.  Feeling cold or chills.  Weak or tired.  Sleepiness and fatigue.  Soreness and body aches. These side effects can affect parts of the body that were not involved in surgery. Follow these instructions at home:  For at least 24 hours after the procedure:  Have a responsible adult stay with you. It is important to have someone help care for you until you are awake and alert.  Rest as needed.  Do not: ? Participate in activities in which you could fall or become injured. ? Drive. ? Use heavy machinery. ? Drink alcohol. ? Take sleeping pills or medicines that cause drowsiness. ? Make important decisions or sign legal documents. ? Take care of children on your own. Eating and drinking  Follow any instructions from your health care provider about eating or drinking restrictions.  When you feel hungry, start by eating small amounts of foods that are soft and easy to digest (bland), such as toast. Gradually return to your regular diet.  Drink enough fluid to keep your urine pale yellow.  If you vomit, rehydrate by drinking water, juice, or clear broth. General instructions  If you have sleep apnea, surgery and certain medicines can increase your risk for breathing problems. Follow instructions from your health care provider about wearing your sleep device: ? Anytime you are sleeping, including during daytime naps. ? While taking prescription pain medicines, sleeping medicines, or medicines that make you drowsy.  Return to  your normal activities as told by your health care provider. Ask your health care provider what activities are safe for you.  Take over-the-counter and prescription medicines only as told by your health care provider.  If you smoke, do not smoke without supervision.  Keep all follow-up visits as told by your health care provider. This is important. Contact a health care provider if:  You have nausea or vomiting that does not get better with medicine.  You cannot eat or drink without vomiting.  You have pain that does not get better with medicine.  You are unable to pass urine.  You develop a skin rash.  You have a fever.  You have redness around your IV site that gets worse. Get help right away if:  You have difficulty breathing.  You have chest pain.  You have blood in your urine or stool, or you vomit blood. Summary  After the procedure, it is common to have a sore throat or nausea. It is also common to feel tired.  Have a responsible adult stay with you for the first 24 hours after general anesthesia. It is important to have someone help care for you until you are awake and alert.  When you feel hungry, start by eating small amounts of foods that are soft and easy to digest (bland), such as toast. Gradually return to your regular diet.  Drink enough fluid to keep your urine pale yellow.  Return to your normal activities as told by your health care provider. Ask your health care provider what activities are safe for you. This information is not   intended to replace advice given to you by your health care provider. Make sure you discuss any questions you have with your health care provider. Document Revised: 02/21/2017 Document Reviewed: 10/04/2016 Elsevier Patient Education  2020 [employer]vier Inc.  

## 2019-10-15 ENCOUNTER — Encounter
Admission: RE | Disposition: A | Discharge status: Home / Self Care | Payer: Self-pay | Source: Home / Self Care | Attending: Unknown Physician Specialty

## 2019-10-15 ENCOUNTER — Ambulatory Visit: Admitting: Anesthesiology

## 2019-10-15 ENCOUNTER — Encounter: Payer: Self-pay | Admitting: Unknown Physician Specialty

## 2019-10-15 ENCOUNTER — Other Ambulatory Visit: Payer: Self-pay

## 2019-10-15 ENCOUNTER — Ambulatory Visit
Admission: RE | Admit: 2019-10-15 | Discharge: 2019-10-15 | Disposition: A | Discharge status: Home / Self Care | Attending: Unknown Physician Specialty | Admitting: Unknown Physician Specialty

## 2019-10-15 ENCOUNTER — Other Ambulatory Visit: Payer: Self-pay | Admitting: Unknown Physician Specialty

## 2019-10-15 DIAGNOSIS — Z7982 Long term (current) use of aspirin: Secondary | ICD-10-CM | POA: Insufficient documentation

## 2019-10-15 DIAGNOSIS — J029 Acute pharyngitis, unspecified: Secondary | ICD-10-CM | POA: Insufficient documentation

## 2019-10-15 DIAGNOSIS — Z888 Allergy status to other drugs, medicaments and biological substances status: Secondary | ICD-10-CM | POA: Diagnosis not present

## 2019-10-15 DIAGNOSIS — Z79899 Other long term (current) drug therapy: Secondary | ICD-10-CM | POA: Diagnosis not present

## 2019-10-15 DIAGNOSIS — J387 Other diseases of larynx: Secondary | ICD-10-CM | POA: Insufficient documentation

## 2019-10-15 DIAGNOSIS — F17200 Nicotine dependence, unspecified, uncomplicated: Secondary | ICD-10-CM | POA: Diagnosis not present

## 2019-10-15 DIAGNOSIS — I10 Essential (primary) hypertension: Secondary | ICD-10-CM | POA: Diagnosis not present

## 2019-10-15 DIAGNOSIS — E785 Hyperlipidemia, unspecified: Secondary | ICD-10-CM | POA: Diagnosis not present

## 2019-10-15 HISTORY — PX: MICROLARYNGOSCOPY: SHX5208

## 2019-10-15 HISTORY — PX: EXCISION OF TONGUE LESION: SHX6434

## 2019-10-15 LAB — POCT PREGNANCY, URINE: Preg Test, Ur: NEGATIVE

## 2019-10-15 SURGERY — MICROLARYNGOSCOPY
Anesthesia: General | Site: Throat

## 2019-10-15 MED ORDER — ONDANSETRON HCL 4 MG/2ML IJ SOLN
INTRAMUSCULAR | Status: DC | PRN
  Administered 2019-10-15: 4 mg via INTRAVENOUS

## 2019-10-15 MED ORDER — OXYCODONE HCL 5 MG/5ML PO SOLN
5.0000 mg | Freq: Once | ORAL | Status: AC | PRN
  Administered 2019-10-15: 5 mg via ORAL

## 2019-10-15 MED ORDER — LIDOCAINE HCL (CARDIAC) PF 100 MG/5ML IV SOSY
PREFILLED_SYRINGE | INTRAVENOUS | Status: DC | PRN
  Administered 2019-10-15: 30 mg via INTRAVENOUS

## 2019-10-15 MED ORDER — HYDROMORPHONE HCL 1 MG/ML IJ SOLN: 0.2500 mg | INTRAMUSCULAR | Status: DC | PRN

## 2019-10-15 MED ORDER — PHENYLEPHRINE HCL 0.5 % NA SOLN
NASAL | Status: DC | PRN
  Administered 2019-10-15: 15 mL via TOPICAL

## 2019-10-15 MED ORDER — PROMETHAZINE HCL 25 MG/ML IJ SOLN: 6.2500 mg | INTRAMUSCULAR | Status: DC | PRN

## 2019-10-15 MED ORDER — OXYCODONE HCL 5 MG PO TABS: 5.0000 mg | ORAL_TABLET | Freq: Once | ORAL | Status: AC | PRN

## 2019-10-15 MED ORDER — LACTATED RINGERS IV SOLN: INTRAVENOUS | Status: DC

## 2019-10-15 MED ORDER — LIDOCAINE HCL (PF) 1 % IJ SOLN
INTRAMUSCULAR | Status: DC | PRN
  Administered 2019-10-15: 15 mL

## 2019-10-15 MED ORDER — DEXAMETHASONE SODIUM PHOSPHATE 4 MG/ML IJ SOLN
INTRAMUSCULAR | Status: DC | PRN
  Administered 2019-10-15: 4 mg via INTRAVENOUS
  Administered 2019-10-15: 6 mg via INTRAVENOUS

## 2019-10-15 MED ORDER — MEPERIDINE HCL 25 MG/ML IJ SOLN: 6.2500 mg | INTRAMUSCULAR | Status: DC | PRN

## 2019-10-15 MED ORDER — FENTANYL CITRATE (PF) 100 MCG/2ML IJ SOLN
INTRAMUSCULAR | Status: DC | PRN
  Administered 2019-10-15: 50 ug via INTRAVENOUS

## 2019-10-15 MED ORDER — PROPOFOL 10 MG/ML IV BOLUS
INTRAVENOUS | Status: DC | PRN
  Administered 2019-10-15: 150 mg via INTRAVENOUS

## 2019-10-15 MED ORDER — MIDAZOLAM HCL 5 MG/5ML IJ SOLN
INTRAMUSCULAR | Status: DC | PRN
  Administered 2019-10-15: 2 mg via INTRAVENOUS

## 2019-10-15 MED ORDER — SUCCINYLCHOLINE CHLORIDE 20 MG/ML IJ SOLN
INTRAMUSCULAR | Status: DC | PRN
  Administered 2019-10-15: 80 mg via INTRAVENOUS

## 2019-10-15 SURGICAL SUPPLY — 13 items
BLOCK BITE GUARD (MISCELLANEOUS) ×3 IMPLANT
DRAPE HEAD BAR (DRAPES) ×3 IMPLANT
DRSG TELFA 4X3 1S NADH ST (GAUZE/BANDAGES/DRESSINGS) ×3 IMPLANT
GLOVE BIO SURGEON STRL SZ7.5 (GLOVE) ×6 IMPLANT
KIT TURNOVER KIT A (KITS) ×3 IMPLANT
NS IRRIG 500ML POUR BTL (IV SOLUTION) ×3 IMPLANT
PACK ENT CUSTOM (PACKS) ×3 IMPLANT
PATTIES SURGICAL .5 X.5 (GAUZE/BANDAGES/DRESSINGS) ×3 IMPLANT
SOL ANTI-FOG 6CC FOG-OUT (MISCELLANEOUS) ×1 IMPLANT
SOL FOG-OUT ANTI-FOG 6CC (MISCELLANEOUS) ×2
STRAP BODY AND KNEE 60X3 (MISCELLANEOUS) ×3 IMPLANT
TUBING CONN 6MMX3.1M (TUBING) ×2
TUBING SUCTION CONN 0.25 STRL (TUBING) ×1 IMPLANT

## 2019-10-15 NOTE — Transfer of Care (Signed)
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

## 2019-10-15 NOTE — Anesthesia Preprocedure Evaluation (Addendum)
Anesthesia Evaluation  Patient identified by MRN, date of birth, ID band Patient awake    Reviewed: Allergy & Precautions, NPO status , Patient's Chart, lab work & pertinent test results, reviewed documented beta blocker date and time   History of Anesthesia Complications Negative for: history of anesthetic complications  Airway Mallampati: II  TM Distance: >3 FB Neck ROM: Full    Dental   Pulmonary sleep apnea , Current SmokerPatient did not abstain from smoking.,    breath sounds clear to auscultation       Cardiovascular hypertension, (-) angina+ CAD (Non-obstructive)  (-) DOE + dysrhythmias ([place] 1) Supra Ventricular Tachycardia and Ventricular Tachycardia  Rhythm:Regular Rate:Normal   HLD  LHC 10/10/2016: No flow obstructing CAD LAD has one focal 40% proximal stenosis, likely related to catheter induced vasospasm LVEDP = 22 mm Hg Very tortuous coronary arteries    Neuro/Psych PSYCHIATRIC DISORDERS Anxiety    GI/Hepatic neg GERD  ,  Endo/Other    Renal/GU Renal disease (Renal cancer)     Musculoskeletal  (+) Arthritis ,   Abdominal (+) + obese (BMI 30),   Peds  Hematology   Anesthesia Other Findings CT Neck (10/03/19): Thickening and edema of the uvula and epiglottis. Tonsillar prominence left more than right. Findings are consistent with regional inflammatory disease.  Reproductive/Obstetrics                            Anesthesia Physical Anesthesia Plan  ASA: III  Anesthesia Plan: General   Post-op Pain Management:    Induction: Intravenous  PONV Risk Score and Plan: 3 and Treatment may vary due to age or medical condition and Ondansetron  Airway Management Planned: Oral ETT  Additional Equipment:   Intra-op Plan:   Post-operative Plan: Extubation in OR  Informed Consent: I have reviewed the patients History and Physical, chart, labs and discussed the procedure  including the risks, benefits and alternatives for the proposed anesthesia with the patient or authorized representative who has indicated his/her understanding and acceptance.       Plan Discussed with: CRNA and Anesthesiologist  Anesthesia Plan Comments:         Anesthesia Quick Evaluation

## 2019-10-15 NOTE — H&P (Signed)
The patient's history has been reviewed, patient examined, no change in status, stable for surgery.  Questions were answered to the patients satisfaction.  

## 2019-10-15 NOTE — Anesthesia Procedure Notes (Signed)
Procedure Name: Intubation Date/Time: 10/15/2019 12:19 PM Performed by: Cameron Ali, CRNA Pre-anesthesia Checklist: Patient identified, Emergency Drugs available, Suction available, Patient being monitored and Timeout performed Patient Re-evaluated:Patient Re-evaluated prior to induction Oxygen Delivery Method: Circle system utilized Preoxygenation: Pre-oxygenation with 100% oxygen Induction Type: IV induction Ventilation: Mask ventilation without difficulty Laryngoscope Size: Mac and 3 Grade View: Grade III Tube type: MLT Tube size: 6.0 mm Number of attempts: 1 Placement Confirmation: ETT inserted through vocal cords under direct vision,  positive ETCO2 and breath sounds checked- equal and bilateral Tube secured with: Tape Dental Injury: Teeth and Oropharynx as per pre-operative assessment

## 2019-10-15 NOTE — Op Note (Signed)
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  After general trach anesthesia the table was turned 90.  A tooth guard was placed.  A Dedo laryngoscope was introduced into the airway and suspended.  Examination of the larynx showed a large cystic mass emanating from the lingual surface of the epiglottis.  With the laryngoscope in position the 0 endoscope was brought into the field examination showed the cysts and this was photo documented.  0 scope was removed and the operating microscope was brought into the field.  A topical anesthetic of phenylephrine lidocaine was used to bathe the cystic mass on a cottonoid pledget.  Using the [facility] microlaryngeal instruments a 45 cup forcep was used to grasp the cyst gently the Shapshay scissors were then used to excise the cyst in its entirety.  Pus was encountered and this was sent for culture.  The cyst was dissected from the entire surface of the angle aspect of the epiglottis.  Cyst was sent for specimen.  Cottonoid pledget again with phenylephrine lidocaine was used to bathe the surface of the epiglottis.  Mucosal layers were laid back in the position there was no active bleeding.  The patient was then returned to anesthesia where she was awakened in the operating room taken recovery in stable condition.  Culture: Epiglottic cyst  Specimen: Epiglottic cyst  Dispo:   Good  Plan: Discharged home follow-up 1 week  Roena Malady  10/15/2019 12:42 PM

## 2019-10-15 NOTE — Anesthesia Postprocedure Evaluation (Signed)
Anesthesia Post Note  Patient: Sheila Walker  Procedure(s) Performed: MICROLARYNGOSCOPY with excision of epiglottic cyst (N/A Throat) EXCISION OF EPIGLOTTIC (N/A Throat)     Patient location during evaluation: PACU Anesthesia Type: General Level of consciousness: awake and alert Pain management: pain level controlled Vital Signs Assessment: post-procedure vital signs reviewed and stable Respiratory status: spontaneous breathing, nonlabored ventilation, respiratory function stable and patient connected to nasal cannula oxygen Cardiovascular status: blood pressure returned to baseline and stable Postop Assessment: no apparent nausea or vomiting Anesthetic complications: no   No complications documented.  Clarabel Marion A  Juniel Groene

## 2019-10-18 ENCOUNTER — Encounter: Payer: Self-pay | Admitting: Unknown Physician Specialty

## 2019-10-18 ENCOUNTER — Telehealth: Payer: Self-pay | Admitting: Cardiology

## 2019-10-18 NOTE — Telephone Encounter (Signed)
Pt c/o medication issue:  1. Name of Medication: metoprolol succinate (TOPROL-XL) 25 MG 24 hr tablet  2. How are you currently taking this medication (dosage and times per day)? Taking 2 tablets daily  3. Are you having a reaction (difficulty breathing--STAT)? no  4. What is your medication issue? Patient states she was taking 2 tablets daily to slow down her HR, but she is doing better since having a cyst removed. She states she had the cyst removed on Friday that was pressing on her windpipe. She states she can breath better and her resting HR is in the 60's now. Her resting HR prior was in the 90's. She would like to know if she still needs to continue taking the 2 tablets daily or if she can go back to 1. She also would like to know if she still needs her appointment 12/10/2019.

## 2019-10-18 NOTE — Telephone Encounter (Signed)
Yes.  Reduce to once daily.

## 2019-10-18 NOTE — Telephone Encounter (Signed)
Pt states that she saw Dr. Percival Spanish 09/09/19 and from that visit due to her palpittions she was asked to increase her Metoprolol to bid. She has since had a cyst removed from her throat that was putting pressure on her esophagus and this has seemed to take pressure off of her heart and since it was removed 10/15/19 her HR has come down to the 60's and she has not had any more palpittions. She says increasing the metoprolol has never helped her prior to the procedure.   She is asking if she can decrease back to once a day for now.. I advised her that I will need to get this approved by Dr. Percival Spanish. We will call her after he has a chance to review the message.

## 2019-10-19 LAB — SURGICAL PATHOLOGY

## 2019-10-19 NOTE — Telephone Encounter (Signed)
Left message for patient to call back  

## 2019-10-20 LAB — AEROBIC/ANAEROBIC CULTURE W GRAM STAIN (SURGICAL/DEEP WOUND)
Culture: NORMAL
Gram Stain: NONE SEEN

## 2019-10-20 NOTE — Telephone Encounter (Signed)
Left message for patient that MD has OK'ed decrease in metoprolol dose to once daily

## 2019-12-09 DIAGNOSIS — Z7189 Other specified counseling: Secondary | ICD-10-CM | POA: Insufficient documentation

## 2019-12-09 DIAGNOSIS — I2510 Atherosclerotic heart disease of native coronary artery without angina pectoris: Secondary | ICD-10-CM | POA: Insufficient documentation

## 2019-12-09 NOTE — Progress Notes (Signed)
Cardiology Office Note   Date:  12/10/2019 any other records on home new patient  ID:  Sheila Walker, Sheila Walker [birth year], MRN [id number]  PCP:  Alvera Singh, FNP  Cardiologist:   Minus Breeding, MD Referring:  Care, [facility]  Chief Complaint  Patient presents with  . Palpitations      History of Present Illness: Sheila Walker is a 49 y.o. female who  Is referred by Care, [facility] for of palpitations.  The patient has had a cardiac work-up.  She had chest pain and in 2018 she had a cardiac cath.  I was able to review these results.  Her LAD had 40% stenosis but there was no other disease elsewhere.  She did not have a left ventriculogram.  She has been evaluated for palpitations.  There was an event monitor that she wore in October.  This demonstrated 16 beat runs of SVT at the longest and some isolated nonsustained V. tach with the longest being 7 beats.  I suggested beta blockers.  However, following the last appt she had a cyst removed from her throat and subsequently had no further palpitations.  She reduced her beta blocker.  However, she has had some increased heart rate since then.  She feels this episodically.  Her heart will race at times but it is not as bad as it was.  She is not interfering with sleep.   Past Medical History:  Diagnosis Date  . Allergy   . Anxiety   . Arthritis   . Cancer of kidney ([place])   . Hyperlipidemia   . Hypertension   . Palpitations   . Sleep apnea    CPAP    Past Surgical History:  Procedure Laterality Date  . COLONOSCOPY  3 years ago    W/Dr.Gupta  . EXCISION OF TONGUE LESION N/A 10/15/2019   Procedure: EXCISION OF EPIGLOTTIC;  Surgeon: Beverly Gust, MD;  Location: [place];  Service: ENT;  Laterality: N/A;  . MICROLARYNGOSCOPY N/A 10/15/2019   Procedure: MICROLARYNGOSCOPY with excision of epiglottic cyst;  Surgeon: Beverly Gust, MD;  Location: [place];  Service: ENT;  Laterality: N/A;  .  PARTIAL NEPHRECTOMY     Left  . WRIST FRACTURE SURGERY       Current Outpatient Medications  Medication Sig Dispense Refill  . aspirin EC 81 MG tablet Take 1 tablet by mouth daily.    Marland Kitchen atorvastatin (LIPITOR) 10 MG tablet Take 1 tablet (10 mg total) by mouth daily. 90 tablet 1  . Melatonin 3 MG TBDP Take 1 tablet by mouth daily.    . metoprolol succinate (TOPROL-XL) 25 MG 24 hr tablet Take 1 tablet (25 mg total) by mouth in the morning and at bedtime. 180 tablet 3  . nicotine (NICODERM CQ - DOSED IN MG/24 HOURS) 14 mg/24hr patch Place onto the skin.    Marland Kitchen NIFEdipine (PROCARDIA-XL/NIFEDICAL-XL) 30 MG 24 hr tablet Take by mouth.    . polyethylene glycol powder (GLYCOLAX/MIRALAX) 17 GM/SCOOP powder polyethylene glycol 3350 17 gram/dose oral powder  MIX AND DRINK  17 GRAMS PO DAILY UTD    . Prenatal Vit-Fe Fumarate-FA (PNV PRENATAL PLUS MULTIVITAMIN) 27-1 MG TABS Take 1 tablet by mouth daily.     Current Facility-Administered Medications  Medication Dose Route Frequency Provider Last Rate Last Admin  . 0.9 %  sodium chloride infusion  500 mL Intravenous Once Jackquline Denmark, MD        Allergies:   Amlodipine, Azithromycin, Lisinopril, Isosorbide  nitrate, and Etodolac   ROS:  Please see the history of present illness.   Otherwise, review of systems are positive for none.   All other systems are reviewed and negative.    PHYSICAL EXAM: VS:  BP 125/73   Pulse (!) 102   Temp (!) 97.2 F (36.2 C)   Ht 5\' 7"  (1.702 m)   Wt 199 lb 3.2 oz (90.4 kg)   SpO2 98%   BMI 31.20 kg/m  , BMI Body mass index is 31.2 kg/m. GENERAL:  Well appearing NECK:  No jugular venous distention, waveform within normal limits, carotid upstroke brisk and symmetric, no bruits, no thyromegaly LUNGS:  Clear to auscultation bilaterally CHEST:  Unremarkable HEART:  PMI not displaced or sustained,S1 and S2 within normal limits, no S3, no S4, no clicks, no rubs, no murmurs ABD:  Flat, positive bowel sounds normal in  frequency in pitch, no bruits, no rebound, no guarding, no midline pulsatile mass, no hepatomegaly, no splenomegaly EXT:  2 plus pulses throughout, trace edema, no cyanosis no clubbing   EKG:  EKG is  ordered today. The ekg ordered today demonstrates sinus rhythm, rate 102, axis within normal limits, intervals within normal limits, no acute ST-T wave changes.   Recent Labs: 10/03/2019: BUN 12; Creatinine, Ser 0.52; Hemoglobin 13.4; Platelets 280; Potassium 3.6; Sodium 136    Lipid Panel No results found for: CHOL, TRIG, HDL, CHOLHDL, VLDL, LDLCALC, LDLDIRECT    Wt Readings from Last 3 Encounters:  12/10/19 199 lb 3.2 oz (90.4 kg)  10/15/19 192 lb (87.1 kg)  10/04/19 193 lb (87.5 kg)      Other studies Reviewed: Additional studies/ records that were reviewed today include: Labs Review of the above records demonstrates:  Please see elsewhere in the note.     ASSESSMENT AND PLAN:  PALPITATIONS:    I am going to increase her metoprolol back to 25 mg XL twice daily.  She will continue her other meds as listed.   HTN: Her blood pressure is at target.  Change as above.   CAD: She had nonobstructive disease and no further symptoms suggestive of new onset angina.  She has some atypical chest pain and will let me know if this increases and I would have a low threshold for further testing such as treadmill testing.   DYSLIPIDEMIA:   I have asked her to get a lipid profile with her primary provider and I think goal should be an LDL less than 70.   TOBACCO ABUSE:   We talked about the need to stop smoking again.   COVID EDUCATION: She has had Covid vaccine.  She had Moderna and I would suggest the booster when able.  Current medicines are reviewed at length with the patient today.  The patient does not have concerns regarding medicines.  The following changes have been made: As above  Labs/ tests ordered today include: None  Orders Placed This Encounter  Procedures  . EKG 12-Lead       Disposition:   FU with me in 12 months or sooner.   Signed, Minus Breeding, MD  12/10/2019 10:09 AM    [facility]

## 2019-12-10 ENCOUNTER — Ambulatory Visit (INDEPENDENT_AMBULATORY_CARE_PROVIDER_SITE_OTHER): Admitting: Cardiology

## 2019-12-10 ENCOUNTER — Other Ambulatory Visit: Payer: Self-pay

## 2019-12-10 ENCOUNTER — Encounter: Payer: Self-pay | Admitting: Cardiology

## 2019-12-10 VITALS — BP 125/73 | HR 102 | Temp 97.2°F | Ht 67.0 in | Wt 199.2 lb

## 2019-12-10 DIAGNOSIS — I2510 Atherosclerotic heart disease of native coronary artery without angina pectoris: Secondary | ICD-10-CM | POA: Diagnosis not present

## 2019-12-10 DIAGNOSIS — I10 Essential (primary) hypertension: Secondary | ICD-10-CM | POA: Diagnosis not present

## 2019-12-10 DIAGNOSIS — Z720 Tobacco use: Secondary | ICD-10-CM

## 2019-12-10 DIAGNOSIS — R002 Palpitations: Secondary | ICD-10-CM | POA: Diagnosis not present

## 2019-12-10 DIAGNOSIS — E785 Hyperlipidemia, unspecified: Secondary | ICD-10-CM | POA: Diagnosis not present

## 2019-12-10 DIAGNOSIS — Z7189 Other specified counseling: Secondary | ICD-10-CM

## 2019-12-10 MED ORDER — METOPROLOL SUCCINATE ER 25 MG PO TB24: 25.0000 mg | ORAL_TABLET | Freq: Two times a day (BID) | ORAL | 3 refills | Status: DC

## 2019-12-10 NOTE — Patient Instructions (Signed)
Medication Instructions:  INCREASE metoprolol succinate (Toprol XL) to 25mg  twice daily  *If you need a refill on your cardiac medications before your next appointment, please call your pharmacy*   Follow-Up: At [facility], you and your health needs are our priority.  As part of our continuing mission to provide you with exceptional heart care, we have created designated Provider Care Teams.  These Care Teams include your primary Cardiologist (physician) and Advanced Practice Providers (APPs -  Physician Assistants and Nurse Practitioners) who all work together to provide you with the care you need, when you need it.  We recommend signing up for the patient portal called "MyChart".  Sign up information is provided on this After Visit Summary.  MyChart is used to connect with patients for Virtual Visits (Telemedicine).  Patients are able to view lab/test results, encounter notes, upcoming appointments, etc.  Non-urgent messages can be sent to your provider as well.   To learn more about what you can do with MyChart, go to [email or web address].    Your next appointment:   12 month(s)  The format for your next appointment:   In Person  Provider:   You may see Dr. Percival Spanish or one of the following Advanced Practice Providers on your designated Care Team:    Rosaria Ferries, PA-C  Jory Sims, DNP, ANP    Other Instructions

## 2020-01-21 ENCOUNTER — Other Ambulatory Visit: Payer: Self-pay

## 2020-01-21 ENCOUNTER — Ambulatory Visit
Admission: RE | Admit: 2020-01-21 | Discharge: 2020-01-21 | Disposition: A | Discharge status: Home / Self Care | Payer: Disability Insurance | Source: Ambulatory Visit | Attending: Thoracic Surgery | Admitting: Thoracic Surgery

## 2020-01-21 ENCOUNTER — Other Ambulatory Visit: Payer: Self-pay | Admitting: Thoracic Surgery

## 2020-01-21 ENCOUNTER — Ambulatory Visit
Admission: RE | Admit: 2020-01-21 | Discharge: 2020-01-21 | Disposition: A | Discharge status: Home / Self Care | Payer: Disability Insurance | Attending: Thoracic Surgery | Admitting: Thoracic Surgery

## 2020-01-21 DIAGNOSIS — M545 Low back pain, unspecified: Secondary | ICD-10-CM

## 2020-01-21 DIAGNOSIS — M25562 Pain in left knee: Secondary | ICD-10-CM | POA: Insufficient documentation

## 2020-01-21 DIAGNOSIS — M25561 Pain in right knee: Secondary | ICD-10-CM

## 2020-12-27 ENCOUNTER — Other Ambulatory Visit: Payer: Self-pay | Admitting: Cardiology

## 2021-01-31 ENCOUNTER — Emergency Department

## 2021-01-31 ENCOUNTER — Encounter: Payer: Self-pay | Admitting: *Deleted

## 2021-01-31 ENCOUNTER — Other Ambulatory Visit: Payer: Self-pay

## 2021-01-31 DIAGNOSIS — Z8616 Personal history of COVID-19: Secondary | ICD-10-CM | POA: Diagnosis not present

## 2021-01-31 DIAGNOSIS — S79911A Unspecified injury of right hip, initial encounter: Secondary | ICD-10-CM | POA: Diagnosis present

## 2021-01-31 DIAGNOSIS — Z85528 Personal history of other malignant neoplasm of kidney: Secondary | ICD-10-CM | POA: Insufficient documentation

## 2021-01-31 DIAGNOSIS — W010XXA Fall on same level from slipping, tripping and stumbling without subsequent striking against object, initial encounter: Secondary | ICD-10-CM | POA: Insufficient documentation

## 2021-01-31 DIAGNOSIS — Z7982 Long term (current) use of aspirin: Secondary | ICD-10-CM | POA: Insufficient documentation

## 2021-01-31 DIAGNOSIS — S7001XA Contusion of right hip, initial encounter: Secondary | ICD-10-CM | POA: Diagnosis not present

## 2021-01-31 DIAGNOSIS — I10 Essential (primary) hypertension: Secondary | ICD-10-CM | POA: Insufficient documentation

## 2021-01-31 DIAGNOSIS — Z79899 Other long term (current) drug therapy: Secondary | ICD-10-CM | POA: Diagnosis not present

## 2021-01-31 DIAGNOSIS — I2510 Atherosclerotic heart disease of native coronary artery without angina pectoris: Secondary | ICD-10-CM | POA: Insufficient documentation

## 2021-01-31 DIAGNOSIS — F17210 Nicotine dependence, cigarettes, uncomplicated: Secondary | ICD-10-CM | POA: Insufficient documentation

## 2021-01-31 NOTE — ED Triage Notes (Signed)
Pt was feeding the dogs tonight and tripped and fell.  Pt has tailbone pain landed on cement..  Pt alert  speech clear.

## 2021-02-01 ENCOUNTER — Emergency Department
Admission: EM | Admit: 2021-02-01 | Discharge: 2021-02-01 | Disposition: A | Discharge status: Home / Self Care | Attending: Emergency Medicine | Admitting: Emergency Medicine

## 2021-02-01 ENCOUNTER — Emergency Department

## 2021-02-01 DIAGNOSIS — W19XXXA Unspecified fall, initial encounter: Secondary | ICD-10-CM

## 2021-02-01 DIAGNOSIS — S7001XA Contusion of right hip, initial encounter: Secondary | ICD-10-CM

## 2021-02-01 MED ORDER — KETOROLAC TROMETHAMINE 30 MG/ML IJ SOLN
30.0000 mg | Freq: Once | INTRAMUSCULAR | Status: AC
  Administered 2021-02-01: 30 mg via INTRAMUSCULAR
  Filled 2021-02-01: qty 1

## 2021-02-01 NOTE — ED Provider Notes (Signed)
[facility] Emergency Department Provider Note   ____________________________________________   Event Date/Time   First MD Initiated Contact with Patient 02/01/21 [phone]     (approximate)  I have reviewed the triage vital signs and the nursing notes.   HISTORY  Chief Complaint Tailbone Pain    HPI Sheila Walker is a 50 y.o. female with past medical history of hypertension, hyperlipidemia, CAD, and OSA who presents to the ED complaining of fall.  Patient reports that just prior to arrival she slipped in her garage, falling onto her backside.  She denies hitting her head or losing consciousness, primarily complains of pain along the posterior portion of her right hip.  She states it has been difficult for her to walk since the fall due to severe pain when bearing weight on her right leg.  She denies any pain in her right knee or ankle.  She has not taken anything for pain prior to arrival.        Past Medical History:  Diagnosis Date   Allergy    Anxiety    Arthritis    Cancer of kidney ([place])    Hyperlipidemia    Hypertension    Palpitations    Sleep apnea    CPAP    Patient Active Problem List   Diagnosis Date Noted   Educated about COVID-19 virus infection 12/09/2019   Coronary artery disease involving native coronary artery of native heart without angina pectoris 12/09/2019   Dyslipidemia 09/08/2019   Secondary hypertension 09/08/2019   OSA (obstructive sleep apnea) 04/27/2017   Palpitations 04/13/2017   Abnormal stress test 09/27/2016   Abnormal uterine bleeding 02/20/2015   Cervical polyp 02/20/2015   Influenza vaccination declined 01/31/2015   Bowel dysfunction 06/22/2013   Depression screening 04/14/2013   Immunization counseling 03/05/2013   Right knee pain 03/05/2013   Low back pain 07/16/2012   Neck pain 07/16/2012   Primary localized osteoarthrosis of hand 02/15/2011   Abnormal glandular Papanicolaou smear of cervix  06/15/2010   Hypertension, benign 06/01/2010   Tobacco use disorder 06/01/2010    Past Surgical History:  Procedure Laterality Date   COLONOSCOPY  3 years ago    W/Dr.Gupta   EXCISION OF TONGUE LESION N/A 10/15/2019   Procedure: EXCISION OF EPIGLOTTIC;  Surgeon: Beverly Gust, MD;  Location: [place];  Service: ENT;  Laterality: N/A;   MICROLARYNGOSCOPY N/A 10/15/2019   Procedure: MICROLARYNGOSCOPY with excision of epiglottic cyst;  Surgeon: Beverly Gust, MD;  Location: [place];  Service: ENT;  Laterality: N/A;   PARTIAL NEPHRECTOMY     Left   WRIST FRACTURE SURGERY      Prior to Admission medications   Medication Sig Start Date End Date Taking? Authorizing Provider  aspirin EC 81 MG tablet Take 1 tablet by mouth daily. 10/15/15   [provider]  atorvastatin (LIPITOR) 10 MG tablet Take 1 tablet (10 mg total) by mouth daily. 09/09/19 09/08/20  Minus Breeding, MD  Melatonin 3 MG TBDP Take 1 tablet by mouth daily. 08/14/16   [provider]  metoprolol succinate (TOPROL-XL) 25 MG 24 hr tablet TAKE 1 TABLET(25 MG) BY MOUTH IN THE MORNING AND AT BEDTIME 12/27/20   Minus Breeding, MD  nicotine (NICODERM CQ - DOSED IN MG/24 HOURS) 14 mg/24hr patch Place onto the skin. 08/11/19   [provider]  NIFEdipine (PROCARDIA-XL/NIFEDICAL-XL) 30 MG 24 hr tablet Take by mouth. 07/30/19   [provider]  polyethylene glycol powder (GLYCOLAX/MIRALAX)  17 GM/SCOOP powder polyethylene glycol 3350 17 gram/dose oral powder  MIX AND DRINK  17 GRAMS PO DAILY UTD 08/26/17   [provider]  Prenatal Vit-Fe Fumarate-FA (PNV PRENATAL PLUS MULTIVITAMIN) 27-1 MG TABS Take 1 tablet by mouth daily.    [provider]    Allergies Amlodipine, Azithromycin, Lisinopril, Isosorbide nitrate, and Etodolac  Family History  Problem Relation Age of Onset   Colon cancer Maternal Uncle 60   Colon cancer Maternal Grandfather 60   Esophageal cancer  Neg Hx    Stomach cancer Neg Hx     Social History Social History   Tobacco Use   Smoking status: Every Day    Packs/day: 0.75    Types: Cigarettes   Smokeless tobacco: Never  Vaping Use   Vaping Use: Never used  Substance Use Topics   Alcohol use: No   Drug use: Yes    Types: Marijuana    Comment: 1-2 times per week per pt    Review of Systems  Constitutional: No fever/chills Eyes: No visual changes. ENT: No sore throat. Cardiovascular: Denies chest pain. Respiratory: Denies shortness of breath. Gastrointestinal: No abdominal pain.  No nausea, no vomiting.  No diarrhea.  No constipation. Genitourinary: Negative for dysuria. Musculoskeletal: Positive for back and hip pain. Skin: Negative for rash. Neurological: Negative for headaches, focal weakness or numbness.  ____________________________________________   PHYSICAL EXAM:  VITAL SIGNS: ED Triage Vitals  Enc Vitals Group     BP 01/31/21 2148 109/75     Pulse Rate 01/31/21 2148 92     Resp 01/31/21 2148 20     Temp 01/31/21 2148 98.6 F (37 C)     Temp Source 01/31/21 2148 Oral     SpO2 01/31/21 2148 99 %     Weight 01/31/21 2146 200 lb (90.7 kg)     Height 01/31/21 2146 5\' 7"  (1.702 m)     Head Circumference --      Peak Flow --      Pain Score 01/31/21 2145 10     Pain Loc --      Pain Edu? --      Excl. in [place]? --     Constitutional: Alert and oriented. Eyes: Conjunctivae are normal. Head: Atraumatic. Nose: No congestion/rhinnorhea. Mouth/Throat: Mucous membranes are moist. Neck: Normal ROM Cardiovascular: Normal rate, regular rhythm. Grossly normal heart sounds.  2+ DP pulses bilaterally. Respiratory: Normal respiratory effort.  No retractions. Lungs CTAB. Gastrointestinal: Soft and nontender. No distention. Genitourinary: deferred Musculoskeletal: Tenderness to palpation noted to area of coccyx and right lateral lumbar area along with posterior right hip.  No obvious deformities noted.  No  tenderness to palpation at right knee or ankle. Neurologic:  Normal speech and language. No gross focal neurologic deficits are appreciated. Skin:  Skin is warm, dry and intact. No rash noted. Psychiatric: Mood and affect are normal. Speech and behavior are normal.  ____________________________________________   LABS (all labs ordered are listed, but only abnormal results are displayed)  Labs Reviewed - No data to display   PROCEDURES  Procedure(s) performed (including Critical Care):  Procedures   ____________________________________________   INITIAL IMPRESSION / ASSESSMENT AND PLAN / ED COURSE      50 year old female with past medical history of hypertension, hyperlipidemia, CAD, and OSA who presents to the ED complaining of tailbone and right posterior hip pain following a slip and fall earlier this evening.  Patient is neurovascularly intact to her right lower extremity, x-rays performed of  lumbar spine, coccyx, and right hip.  Imaging reviewed by me with no evidence of fracture or dislocation.  Pain was treated with IM Toradol with improvement, patient now able to ambulate with assistance.  She is appropriate for discharge home with PCP follow-up, was counseled to alternate Tylenol and ibuprofen at home and to return to the ED for new worsening symptoms.  Patient agrees with plan.      ____________________________________________   FINAL CLINICAL IMPRESSION(S) / ED DIAGNOSES  Final diagnoses:  Fall, initial encounter  Contusion of right hip, initial encounter     ED Discharge Orders     None        Note:  This document was prepared using Dragon voice recognition software and may include unintentional dictation errors.    Blake Divine, MD 02/01/21 [phone]

## 2021-05-24 NOTE — Progress Notes (Signed)
?  ?Cardiology Office Note ? ? ?Date:  05/25/2021 any other records on home new patient ? ?ID:  Sheila Walker, DOB April 28, 1970, MRN [id number] ? ?PCP:  Alvera Singh, FNP  ?Cardiologist:   Minus Breeding, MD ?Referring:  Alvera Singh, [place] ? ?Chief Complaint  ?Patient presents with  ? Chest Pain  ? ? ?  ?History of Present Illness: ?Sheila Walker is a 51 y.o. female who  Is referred by Alvera Singh, [place] for of palpitations.  The patient has had a cardiac work-up.  She had chest pain and in 2018 she had a cardiac cath.  I was able to review these results.  Her LAD had 40% stenosis but there was no other disease elsewhere.  She did not have a left ventriculogram.  She has been evaluated for palpitations.  There was an event monitor that she wore in October.  This demonstrated 16 beat runs of SVT at the longest and some isolated nonsustained V. tach with the longest being 7 beats.   ? ?He does get these pinching chest pains.  These seem to happen more routinely recently.  She describes a pinching discomfort in her left upper chest.  She does not have associated nausea vomiting or diaphoresis.  It does not radiate to her jaw or to her arms.  It is not a substernal discomfort.  It lasts for a minute or so.  She cannot bring it on.  It comes at rest.  There is no associated shortness of breath.  She does not have PND or orthopnea.  She does do some activities. ? ? ?Past Medical History:  ?Diagnosis Date  ? Allergy   ? Anxiety   ? Arthritis   ? Cancer of kidney ([place])   ? Hyperlipidemia   ? Hypertension   ? Palpitations   ? Sleep apnea   ? CPAP  ? ? ?Past Surgical History:  ?Procedure Laterality Date  ? COLONOSCOPY  3 years ago   ? W/Dr.Gupta  ? EXCISION OF TONGUE LESION N/A 10/15/2019  ? Procedure: EXCISION OF EPIGLOTTIC;  Surgeon: Beverly Gust, MD;  Location: [facility];  Service: ENT;  Laterality: N/A;  ? MICROLARYNGOSCOPY N/A 10/15/2019  ? Procedure: MICROLARYNGOSCOPY with  excision of epiglottic cyst;  Surgeon: Beverly Gust, MD;  Location: [facility];  Service: ENT;  Laterality: N/A;  ? PARTIAL NEPHRECTOMY    ? Left  ? WRIST FRACTURE SURGERY    ? ? ? ?Current Outpatient Medications  ?Medication Sig Dispense Refill  ? buPROPion (WELLBUTRIN SR) 150 MG 12 hr tablet Take 150 mg by mouth daily.    ? fluticasone (FLONASE) 50 MCG/ACT nasal spray     ? hydrOXYzine (ATARAX) 25 MG tablet     ? Melatonin 3 MG TBDP Take 1 tablet by mouth daily.    ? meloxicam (MOBIC) 7.5 MG tablet Take 7.5 mg by mouth daily.    ? metoprolol succinate (TOPROL-XL) 25 MG 24 hr tablet TAKE 1 TABLET(25 MG) BY MOUTH IN THE MORNING AND AT BEDTIME 180 tablet 3  ? nicotine (NICODERM CQ - DOSED IN MG/24 HOURS) 14 mg/24hr patch Place onto the skin.    ? NIFEdipine (PROCARDIA-XL/NIFEDICAL-XL) 30 MG 24 hr tablet Take by mouth.    ? polyethylene glycol powder (GLYCOLAX/MIRALAX) 17 GM/SCOOP powder polyethylene glycol 3350 17 gram/dose oral powder ? MIX AND DRINK  17 GRAMS PO DAILY UTD    ? Prenatal Vit-Fe Fumarate-FA (PNV PRENATAL PLUS MULTIVITAMIN) 27-1 MG TABS Take 1 tablet  by mouth daily.    ? aspirin EC 81 MG tablet Take 1 tablet by mouth daily. (Patient not taking: Reported on 05/25/2021)    ? atorvastatin (LIPITOR) 10 MG tablet Take 1 tablet (10 mg total) by mouth daily. 90 tablet 1  ? ?Current Facility-Administered Medications  ?Medication Dose Route Frequency Provider Last Rate Last Admin  ? 0.9 %  sodium chloride infusion  500 mL Intravenous Once Jackquline Denmark, MD      ? ? ?Allergies:   Amlodipine, Azithromycin, Lisinopril, Isosorbide nitrate, and Etodolac  ? ?ROS:  Please see the history of present illness.   Otherwise, review of systems are positive for knee pain.   All other systems are reviewed and negative.  ? ? ?PHYSICAL EXAM: ?VS:  BP 120/82   Pulse 86   Ht '5\' 7"'$  (1.702 m)   Wt 212 lb (96.2 kg)   LMP 08/09/2019 (Approximate)   SpO2 96%   BMI 33.20 kg/m?  , BMI Body mass index is 33.2  kg/m?. ?GENERAL:  Well appearing ?NECK:  No jugular venous distention, waveform within normal limits, carotid upstroke brisk and symmetric, no bruits, no thyromegaly ?LUNGS:  Clear to auscultation bilaterally ?CHEST:  Unremarkable ?HEART:  PMI not displaced or sustained,S1 and S2 within normal limits, no S3, no S4, no clicks, no rubs, no murmurs ?ABD:  Flat, positive bowel sounds normal in frequency in pitch, no bruits, no rebound, no guarding, no midline pulsatile mass, no hepatomegaly, no splenomegaly ?EXT:  2 plus pulses throughout, no edema, no cyanosis no clubbing ? ? ?EKG:  EKG is  ordered today. ?The ekg ordered today demonstrates sinus rhythm, rate 86, axis within normal limits, intervals within normal limits, no acute ST-T wave changes. ? ? ?Recent Labs: ?No results found for requested labs within last 8760 hours.  ? ? ?Lipid Panel ?No results found for: CHOL, TRIG, HDL, CHOLHDL, VLDL, LDLCALC, LDLDIRECT ?  ? ?Wt Readings from Last 3 Encounters:  ?05/25/21 212 lb (96.2 kg)  ?01/31/21 200 lb (90.7 kg)  ?12/10/19 199 lb 3.2 oz (90.4 kg)  ?  ? ? ?Other studies Reviewed: ?Additional studies/ records that were reviewed today include: Labs ?Review of the above records demonstrates:  Please see elsewhere in the note.   ? ? ?ASSESSMENT AND PLAN: ? ?PALPITATIONS:    She is actually only taking her metoprolol at night and she has good control of her palpitations with this.  She could take the morning dose if she needs it.  She will continue with the Procardia.  No further work-up.  ? ?HTN: Her blood pressure is controlled.  No change in therapy.  ? ?CAD:   She is having some chest discomfort.  I think the pretest probability of obstructive coronary disease is low but I will screen her with a POET (Plain Old Exercise Treadmill)  ? ?DYSLIPIDEMIA:    LDL was not checked recently and I will order a fasting lipid profile.  ? ?TOBACCO ABUSE:   She uses Wellbutrin.  We talked about the patch.  She understands the need to  quit smoking completely.  ? ?Current medicines are reviewed at length with the patient today.  The patient does not have concerns regarding medicines. ? ?The following changes have been made:  None ? ?Labs/ tests ordered today include:   ? ?Orders Placed This Encounter  ?Procedures  ? Lipid panel  ? EXERCISE TOLERANCE TEST (ETT)  ? EKG 12-Lead  ? ? ? ?Disposition:   FU with me  in in 1 year or sooner if needed. ? ? ?Signed, ?Minus Breeding, MD  ?05/25/2021 10:58 AM    ?[place] ? ? ?

## 2021-05-25 ENCOUNTER — Ambulatory Visit (INDEPENDENT_AMBULATORY_CARE_PROVIDER_SITE_OTHER): Admitting: Cardiology

## 2021-05-25 ENCOUNTER — Telehealth (HOSPITAL_COMMUNITY): Payer: Self-pay | Admitting: *Deleted

## 2021-05-25 ENCOUNTER — Encounter: Payer: Self-pay | Admitting: Cardiology

## 2021-05-25 ENCOUNTER — Other Ambulatory Visit: Payer: Self-pay

## 2021-05-25 VITALS — BP 120/82 | HR 86 | Ht 67.0 in | Wt 212.0 lb

## 2021-05-25 DIAGNOSIS — R002 Palpitations: Secondary | ICD-10-CM | POA: Diagnosis not present

## 2021-05-25 DIAGNOSIS — I2510 Atherosclerotic heart disease of native coronary artery without angina pectoris: Secondary | ICD-10-CM

## 2021-05-25 DIAGNOSIS — Z720 Tobacco use: Secondary | ICD-10-CM

## 2021-05-25 DIAGNOSIS — R079 Chest pain, unspecified: Secondary | ICD-10-CM

## 2021-05-25 DIAGNOSIS — E785 Hyperlipidemia, unspecified: Secondary | ICD-10-CM

## 2021-05-25 NOTE — Patient Instructions (Signed)
Medication Instructions:  ?Your physician recommends that you continue on your current medications as directed. Please refer to the Current Medication list given to you today. ? ?*If you need a refill on your cardiac medications before your next appointment, please call your pharmacy* ? ?Lab Work: ?Please return for FASTING labs (Lipid)-ok to have this drawn when you come back for your stress test.  ? ?Our in office lab hours are Monday-Friday 8:00-4:00, closed for lunch 12:45-1:45 pm.  No appointment needed. ? ?Testing/Procedures: ?Your physician has requested that you have an exercise tolerance test. For further information please visit [email or web address]. Please also follow instruction sheet, as given. ? ?Follow-Up: ?At [facility], you and your health needs are our priority.  As part of our continuing mission to provide you with exceptional heart care, we have created designated Provider Care Teams.  These Care Teams include your primary Cardiologist (physician) and Advanced Practice Providers (APPs -  Physician Assistants and Nurse Practitioners) who all work together to provide you with the care you need, when you need it. ? ?We recommend signing up for the patient portal called "MyChart".  Sign up information is provided on this After Visit Summary.  MyChart is used to connect with patients for Virtual Visits (Telemedicine).  Patients are able to view lab/test results, encounter notes, upcoming appointments, etc.  Non-urgent messages can be sent to your provider as well.   ?To learn more about what you can do with MyChart, go to [email or web address].   ? ?Your next appointment:   ?12 month(s) ? ?The format for your next appointment:   ?In Person ? ?Provider:   ?Minus Breeding, MD { ? ? ? ?

## 2021-05-25 NOTE — Telephone Encounter (Signed)
Close encounter 

## 2021-05-29 ENCOUNTER — Other Ambulatory Visit: Payer: Self-pay

## 2021-05-29 ENCOUNTER — Ambulatory Visit (HOSPITAL_COMMUNITY)
Admission: RE | Admit: 2021-05-29 | Discharge: 2021-05-29 | Disposition: A | Discharge status: Home / Self Care | Source: Ambulatory Visit | Attending: Cardiology | Admitting: Cardiology

## 2021-05-29 DIAGNOSIS — R079 Chest pain, unspecified: Secondary | ICD-10-CM | POA: Insufficient documentation

## 2021-05-29 LAB — EXERCISE TOLERANCE TEST
Angina Index: 2
Duke Treadmill Score: -7
Estimated workload: 7.4
Exercise duration (min): 6 min
Exercise duration (sec): 17 s
MPHR: 168 {beats}/min
Peak HR: 169 {beats}/min
Percent HR: 100 %
Rest HR: 76 {beats}/min
ST Depression (mm): 1 mm

## 2021-05-30 LAB — LIPID PANEL
Chol/HDL Ratio: 3.3 ratio (ref 0.0–4.4)
Cholesterol, Total: 144 mg/dL (ref 100–199)
HDL: 43 mg/dL (ref >39)
LDL Chol Calc (NIH): 77 mg/dL (ref 0–99)
Triglycerides: 139 mg/dL (ref 0–149)
VLDL Cholesterol Cal: 24 mg/dL (ref 5–40)

## 2021-06-04 ENCOUNTER — Encounter: Payer: Self-pay | Admitting: *Deleted

## 2021-09-28 ENCOUNTER — Other Ambulatory Visit: Payer: Self-pay | Admitting: Cardiology

## 2022-11-21 ENCOUNTER — Other Ambulatory Visit: Payer: Self-pay

## 2022-11-21 ENCOUNTER — Inpatient Hospital Stay (HOSPITAL_COMMUNITY)
Admission: EM | Admit: 2022-11-21 | Discharge: 2022-11-27 | DRG: 392 | Disposition: A | Discharge status: Home / Self Care | Attending: Internal Medicine | Admitting: Internal Medicine

## 2022-11-21 ENCOUNTER — Encounter (HOSPITAL_COMMUNITY): Payer: Self-pay

## 2022-11-21 DIAGNOSIS — Z881 Allergy status to other antibiotic agents status: Secondary | ICD-10-CM | POA: Diagnosis not present

## 2022-11-21 DIAGNOSIS — Z905 Acquired absence of kidney: Secondary | ICD-10-CM | POA: Diagnosis not present

## 2022-11-21 DIAGNOSIS — K529 Noninfective gastroenteritis and colitis, unspecified: Secondary | ICD-10-CM | POA: Diagnosis not present

## 2022-11-21 DIAGNOSIS — Z9049 Acquired absence of other specified parts of digestive tract: Secondary | ICD-10-CM | POA: Diagnosis not present

## 2022-11-21 DIAGNOSIS — K5792 Diverticulitis of intestine, part unspecified, without perforation or abscess without bleeding: Principal | ICD-10-CM

## 2022-11-21 DIAGNOSIS — K5732 Diverticulitis of large intestine without perforation or abscess without bleeding: Principal | ICD-10-CM | POA: Diagnosis present

## 2022-11-21 DIAGNOSIS — G4733 Obstructive sleep apnea (adult) (pediatric): Secondary | ICD-10-CM | POA: Diagnosis not present

## 2022-11-21 DIAGNOSIS — I10 Essential (primary) hypertension: Secondary | ICD-10-CM | POA: Diagnosis not present

## 2022-11-21 DIAGNOSIS — Z888 Allergy status to other drugs, medicaments and biological substances status: Secondary | ICD-10-CM | POA: Diagnosis not present

## 2022-11-21 DIAGNOSIS — E785 Hyperlipidemia, unspecified: Secondary | ICD-10-CM | POA: Diagnosis present

## 2022-11-21 DIAGNOSIS — R748 Abnormal levels of other serum enzymes: Secondary | ICD-10-CM | POA: Diagnosis present

## 2022-11-21 DIAGNOSIS — I2510 Atherosclerotic heart disease of native coronary artery without angina pectoris: Secondary | ICD-10-CM | POA: Diagnosis present

## 2022-11-21 DIAGNOSIS — G4730 Sleep apnea, unspecified: Secondary | ICD-10-CM | POA: Diagnosis present

## 2022-11-21 DIAGNOSIS — F17210 Nicotine dependence, cigarettes, uncomplicated: Secondary | ICD-10-CM | POA: Diagnosis not present

## 2022-11-21 DIAGNOSIS — Z86010 Personal history of colonic polyps: Secondary | ICD-10-CM

## 2022-11-21 DIAGNOSIS — K648 Other hemorrhoids: Secondary | ICD-10-CM | POA: Diagnosis present

## 2022-11-21 DIAGNOSIS — Z800 Family history of malignant neoplasm of digestive organs: Secondary | ICD-10-CM

## 2022-11-21 DIAGNOSIS — K30 Functional dyspepsia: Secondary | ICD-10-CM | POA: Diagnosis not present

## 2022-11-21 DIAGNOSIS — E669 Obesity, unspecified: Secondary | ICD-10-CM | POA: Diagnosis not present

## 2022-11-21 DIAGNOSIS — Z79899 Other long term (current) drug therapy: Secondary | ICD-10-CM | POA: Diagnosis not present

## 2022-11-21 DIAGNOSIS — Z6833 Body mass index (BMI) 33.0-33.9, adult: Secondary | ICD-10-CM | POA: Diagnosis not present

## 2022-11-21 DIAGNOSIS — F17200 Nicotine dependence, unspecified, uncomplicated: Secondary | ICD-10-CM | POA: Diagnosis present

## 2022-11-21 DIAGNOSIS — Z7982 Long term (current) use of aspirin: Secondary | ICD-10-CM

## 2022-11-21 DIAGNOSIS — Z85528 Personal history of other malignant neoplasm of kidney: Secondary | ICD-10-CM

## 2022-11-21 LAB — COMPREHENSIVE METABOLIC PANEL
ALT: 13 U/L (ref 0–44)
AST: 14 U/L — ABNORMAL LOW (ref 15–41)
Albumin: 3.7 g/dL (ref 3.5–5.0)
Alkaline Phosphatase: 75 U/L (ref 38–126)
Anion gap: 11 (ref 5–15)
BUN: 7 mg/dL (ref 6–20)
CO2: 21 mmol/L — ABNORMAL LOW (ref 22–32)
Calcium: 9.1 mg/dL (ref 8.9–10.3)
Chloride: 102 mmol/L (ref 98–111)
Creatinine, Ser: 0.71 mg/dL (ref 0.44–1.00)
GFR, Estimated: >60 mL/min (ref >60)
Glucose, Bld: 127 mg/dL — ABNORMAL HIGH (ref 70–99)
Potassium: 3.9 mmol/L (ref 3.5–5.1)
Sodium: 134 mmol/L — ABNORMAL LOW (ref 135–145)
Total Bilirubin: 0.5 mg/dL (ref 0.3–1.2)
Total Protein: 7.2 g/dL (ref 6.5–8.1)

## 2022-11-21 LAB — CBC
HCT: 43.3 % (ref 36.0–46.0)
Hemoglobin: 13.9 g/dL (ref 12.0–15.0)
MCH: 29.2 pg (ref 26.0–34.0)
MCHC: 32.1 g/dL (ref 30.0–36.0)
MCV: 91 fL (ref 80.0–100.0)
Platelets: 272 10*3/uL (ref 150–400)
RBC: 4.76 MIL/uL (ref 3.87–5.11)
RDW: 13.9 % (ref 11.5–15.5)
WBC: 16.2 10*3/uL — ABNORMAL HIGH (ref 4.0–10.5)
nRBC: 0 % (ref 0.0–0.2)

## 2022-11-21 LAB — LIPASE, BLOOD: Lipase: 23 U/L (ref 11–51)

## 2022-11-21 MED ORDER — SODIUM CHLORIDE 0.9 % IV BOLUS
1000.0000 mL | Freq: Once | INTRAVENOUS | Status: AC
  Administered 2022-11-22: 1000 mL via INTRAVENOUS

## 2022-11-21 MED ORDER — HYDROMORPHONE HCL 1 MG/ML IJ SOLN
1.0000 mg | Freq: Once | INTRAMUSCULAR | Status: AC
  Administered 2022-11-22: 1 mg via INTRAVENOUS
  Filled 2022-11-21: qty 1

## 2022-11-21 MED ORDER — PIPERACILLIN-TAZOBACTAM 3.375 G IVPB
3.3750 g | Freq: Three times a day (TID) | INTRAVENOUS | Status: DC
  Administered 2022-11-22 – 2022-11-27 (×17): 3.375 g via INTRAVENOUS
  Filled 2022-11-21 (×17): qty 50

## 2022-11-21 MED ORDER — ONDANSETRON HCL 4 MG/2ML IJ SOLN
4.0000 mg | Freq: Once | INTRAMUSCULAR | Status: AC
  Administered 2022-11-22: 4 mg via INTRAVENOUS
  Filled 2022-11-21: qty 2

## 2022-11-21 NOTE — ED Provider Notes (Signed)
[facility] EMERGENCY DEPARTMENT AT [facility] Provider Note   CSN: [id number] Arrival date & time: 11/21/22  1813     History  Chief Complaint  Patient presents with   abdominal abscess    Sheila Walker is a 52 y.o. female.  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  Started on Monday, has been constant, describes a cramping-like sensation, thought maybe was gas pain, tried to massage it out but is on able to, she denies any bloody emesis or coffee-ground emesis denies any dark stools or bloody stools denies any urinary symptoms, no associate fevers chills cough congestion, she states that the pain has just A lot worse.  She states that she is never had this in the past.  Currently not on any immunosuppressants, not on anticoagulants, no other complaints.  Reviewed patient's chart was seen at her primary care doctor earlier today, obtain CT imaging showing acute sigmoid colitis/diverticulitis with concern for possible small intramural abscess proxy 1 x 1 cm.  Home Medications Prior to Admission medications   Medication Sig Start Date End Date Taking? Authorizing Provider  aspirin EC 81 MG tablet Take 81 mg by mouth every evening. Swallow whole.   Yes [provider]  atorvastatin (LIPITOR) 20 MG tablet Take 20 mg by mouth every evening.   Yes [provider]  buPROPion (WELLBUTRIN SR) 150 MG 12 hr tablet Take 300 mg by mouth at bedtime. 04/18/21  Yes [provider]  CALCIUM PO Take 1 tablet by mouth every evening.   Yes [provider]  cyclobenzaprine (FLEXERIL) 5 MG tablet TAKE 1 TABLET(5 MG) BY MOUTH THREE TIMES DAILY AS NEEDED FOR MUSCLE SPASMS 06/14/21  Yes [provider]  hydrOXYzine (ATARAX) 25 MG tablet  Take 50 mg by mouth at bedtime. 09/15/20  Yes [provider]  magnesium 30 MG tablet Take 30 mg by mouth every evening.   Yes [provider]  Melatonin 3 MG TBDP Take 1 tablet by mouth at bedtime as needed. 08/14/16  Yes [provider]  meloxicam (MOBIC) 7.5 MG tablet Take 7.5 mg by mouth every evening. 04/20/21  Yes [provider]  metoprolol succinate (TOPROL-XL) 25 MG 24 hr tablet TAKE 1 TABLET(25 MG) BY MOUTH IN THE MORNING AND AT BEDTIME Patient taking differently: Take 25 mg by mouth every evening. 09/28/21  Yes Rollene Rotunda, MD  NIFEdipine (PROCARDIA-XL/NIFEDICAL-XL) 30 MG 24 hr tablet Take 30 mg by mouth every evening. 07/30/19  Yes [provider]  Omega-3 Fatty Acids (FISH OIL) 300 MG CAPS Take 1 capsule by mouth every evening.   Yes [provider]  polyethylene glycol powder (GLYCOLAX/MIRALAX) 17 GM/SCOOP powder Take 0.5 Containers by mouth daily as needed for moderate constipation. 08/26/17  Yes [provider]  traZODone (DESYREL) 50 MG tablet Take 50 mg by mouth at bedtime. 03/21/22  Yes [provider]  atorvastatin (LIPITOR) 10 MG tablet Take 1 tablet (10 mg total) by mouth daily. 09/09/19 09/08/20  Rollene Rotunda, MD      Allergies    Amlodipine, Azithromycin, Lisinopril, Isosorbide nitrate, and Etodolac    Review of Systems   Review of Systems  Constitutional:  Negative for chills and fever.  Respiratory:  Negative for shortness of breath.   Cardiovascular:  Negative for chest pain.  Gastrointestinal:  Positive for abdominal pain. Negative  for constipation, diarrhea, nausea and vomiting.  Neurological:  Negative for headaches.    Physical Exam Updated Vital Signs BP 133/87   Pulse 99   Temp 97.8 F (36.6 C) (Oral)   Resp 17   Ht 5\' 7"  (1.702 m)   Wt 96.2 kg   LMP 08/09/2019 (Approximate)   SpO2 98%   BMI 33.22 kg/m  Physical Exam Vitals and nursing note reviewed.  Constitutional:       General: She is not in acute distress.    Appearance: She is not ill-appearing.  HENT:     Head: Normocephalic and atraumatic.     Nose: No congestion.  Eyes:     Conjunctiva/sclera: Conjunctivae normal.  Cardiovascular:     Rate and Rhythm: Normal rate and regular rhythm.     Pulses: Normal pulses.     Heart sounds: No murmur heard.    No friction rub. No gallop.  Pulmonary:     Effort: No respiratory distress.     Breath sounds: No wheezing, rhonchi or rales.  Abdominal:     Palpations: Abdomen is soft.     Tenderness: There is abdominal tenderness. There is no right CVA tenderness or left CVA tenderness.     Comments: Abdomen nondistended, soft, notable tenderness in left lower quadrant, no guarding weakness or peritoneal sign.  Skin:    General: Skin is warm and dry.  Neurological:     Mental Status: She is alert.  Psychiatric:        Mood and Affect: Mood normal.     ED Results / Procedures / Treatments   Labs (all labs ordered are listed, but only abnormal results are displayed) Labs Reviewed  COMPREHENSIVE METABOLIC PANEL - Abnormal; Notable for the following components:      Result Value   Sodium 134 (*)    CO2 21 (*)    Glucose, Bld 127 (*)    AST 14 (*)    All other components within normal limits  CBC - Abnormal; Notable for the following components:   WBC 16.2 (*)    All other components within normal limits  LIPASE, BLOOD  URINALYSIS, ROUTINE W REFLEX MICROSCOPIC  TYPE AND SCREEN  ABO/RH    EKG None  Radiology No results found.  Procedures Procedures    Medications Ordered in ED Medications  piperacillin-tazobactam (ZOSYN) IVPB 3.375 g (0 g Intravenous Stopped 11/22/22 0223)  ondansetron (ZOFRAN) injection 4 mg (has no administration in time range)  HYDROmorphone (DILAUDID) injection 0.5 mg (has no administration in time range)  sodium chloride 0.9 % bolus 1,000 mL (1,000 mLs Intravenous New Bag/Given 11/22/22 0019)  ondansetron (ZOFRAN)  injection 4 mg (4 mg Intravenous Given 11/22/22 0018)  HYDROmorphone (DILAUDID) injection 1 mg (1 mg Intravenous Given 11/22/22 0021)  fentaNYL (SUBLIMAZE) injection 50 mcg (50 mcg Intravenous Given 11/22/22 0139)    ED Course/ Medical Decision Making/ A&P                                 Medical Decision Making Amount and/or Complexity of Data Reviewed Labs: ordered.  Risk Prescription drug management. Decision regarding hospitalization.   This patient presents to the ED for concern of abdominal pain, this involves an extensive number of treatment options, and is a complaint that carries with it a high risk of complications and morbidity.  The differential diagnosis includes diverticulitis, abscess, volvulus, bowel perforation,    Additional history  obtained:  Additional history obtained from N/A External records from outside source obtained and reviewed including PCP notes   Co morbidities that complicate the patient evaluation  Left-sided partial nephrectomy  Social Determinants of Health:  N/A    Lab Tests:  I Ordered, and personally interpreted labs.  The pertinent results include: CBC shows leukocytosis of 16.2, CMP reveals sodium 134 CO2 of 21 glucose 127 AST 14 lipase 23   Imaging Studies ordered:  I ordered imaging studies including N/A I independently visualized and interpreted imaging which showed N/A I agree with the radiologist interpretation   Cardiac Monitoring:  The patient was maintained on a cardiac monitor.  I personally viewed and interpreted the cardiac monitored which showed an underlying rhythm of: Pending   Medicines ordered and prescription drug management:  I ordered medication including Zosyn, pain medication, Zofran, fluids I have reviewed the patients home medicines and have made adjustments as needed  Critical Interventions:  N/A   Reevaluation:  Presenting with concerns of left lower quadrant tenderness, exam is consistent  with CT imaging, will start antibiotics, fluids, keep her n.p.o., consult with general surgery for further recommendations.  Update patient needs recommendation going this plan will admit to medicine.  Consultations Obtained:  I requested consultation with Dr. Sheliah Hatch general surgery,  and discussed lab and imaging findings as well as pertinent plan - they recommend: Hospital admission, they will round on the patient. Spoke with Dr. Joneen Roach who will admit the patient.   Test Considered:  N/A   Rule out low suspicion for lower lobe pneumonia as lung sounds are clear bilaterally, will defer imaging at this time.  I have low suspicion for liver or gallbladder abnormality as she has no right upper quadrant tenderness, liver enzymes, alk phos, T bili all within normal limits.  Low suspicion for pancreatitis as lipase is within normal limits.  Suspicion for UTI [place] kidney stone not endorsing any urinary symptoms, no evidence of infection seen on CT imaging.  I doubt bowel obstruction volvulus intra-abdominal mass CT imaging examining of these findings.  Doubt AAA or dissection presentation atypical.  Suspicion patient needs emergent surgery is low at this time she is hemodynamically stable, she has a nonsurgical abdomen.     Dispostion and problem list  After consideration of the diagnostic results and the patients response to treatment, I feel that the patent would benefit from admission.  Diverticulitis with abscess-started on antibiotics, will need continued monitoring, should remain n.p.o., with formal consultation by general surgery.            Final Clinical Impression(s) / ED Diagnoses Final diagnoses:  Diverticulitis    Rx / DC Orders ED Discharge Orders     None         Carroll Sage, PA-C 11/22/22 0244    Palumbo, April, MD 11/22/22 0246

## 2022-11-21 NOTE — ED Triage Notes (Addendum)
Pt sent by PCP for abscess inside of LLQ of abdomen from CT results. Pt c/o LLQ abdominal painx4d. Pt went to [facility] to get CT done.

## 2022-11-22 ENCOUNTER — Inpatient Hospital Stay (HOSPITAL_COMMUNITY)

## 2022-11-22 DIAGNOSIS — K529 Noninfective gastroenteritis and colitis, unspecified: Secondary | ICD-10-CM | POA: Diagnosis present

## 2022-11-22 DIAGNOSIS — Z9049 Acquired absence of other specified parts of digestive tract: Secondary | ICD-10-CM | POA: Diagnosis not present

## 2022-11-22 DIAGNOSIS — G4733 Obstructive sleep apnea (adult) (pediatric): Secondary | ICD-10-CM | POA: Diagnosis present

## 2022-11-22 DIAGNOSIS — K5732 Diverticulitis of large intestine without perforation or abscess without bleeding: Secondary | ICD-10-CM | POA: Diagnosis present

## 2022-11-22 DIAGNOSIS — F17200 Nicotine dependence, unspecified, uncomplicated: Secondary | ICD-10-CM

## 2022-11-22 DIAGNOSIS — Z85528 Personal history of other malignant neoplasm of kidney: Secondary | ICD-10-CM | POA: Diagnosis not present

## 2022-11-22 DIAGNOSIS — Z881 Allergy status to other antibiotic agents status: Secondary | ICD-10-CM | POA: Diagnosis not present

## 2022-11-22 DIAGNOSIS — G4730 Sleep apnea, unspecified: Secondary | ICD-10-CM | POA: Diagnosis present

## 2022-11-22 DIAGNOSIS — K30 Functional dyspepsia: Secondary | ICD-10-CM | POA: Diagnosis not present

## 2022-11-22 DIAGNOSIS — Z800 Family history of malignant neoplasm of digestive organs: Secondary | ICD-10-CM | POA: Diagnosis not present

## 2022-11-22 DIAGNOSIS — K5792 Diverticulitis of intestine, part unspecified, without perforation or abscess without bleeding: Secondary | ICD-10-CM | POA: Diagnosis present

## 2022-11-22 DIAGNOSIS — E669 Obesity, unspecified: Secondary | ICD-10-CM | POA: Diagnosis present

## 2022-11-22 DIAGNOSIS — R748 Abnormal levels of other serum enzymes: Secondary | ICD-10-CM | POA: Diagnosis present

## 2022-11-22 DIAGNOSIS — Z79899 Other long term (current) drug therapy: Secondary | ICD-10-CM | POA: Diagnosis not present

## 2022-11-22 DIAGNOSIS — Z888 Allergy status to other drugs, medicaments and biological substances status: Secondary | ICD-10-CM | POA: Diagnosis not present

## 2022-11-22 DIAGNOSIS — Z6833 Body mass index (BMI) 33.0-33.9, adult: Secondary | ICD-10-CM | POA: Diagnosis not present

## 2022-11-22 DIAGNOSIS — I10 Essential (primary) hypertension: Secondary | ICD-10-CM | POA: Diagnosis present

## 2022-11-22 DIAGNOSIS — F17210 Nicotine dependence, cigarettes, uncomplicated: Secondary | ICD-10-CM | POA: Diagnosis present

## 2022-11-22 DIAGNOSIS — Z7982 Long term (current) use of aspirin: Secondary | ICD-10-CM | POA: Diagnosis not present

## 2022-11-22 DIAGNOSIS — Z86010 Personal history of colonic polyps: Secondary | ICD-10-CM | POA: Diagnosis not present

## 2022-11-22 DIAGNOSIS — Z905 Acquired absence of kidney: Secondary | ICD-10-CM | POA: Diagnosis not present

## 2022-11-22 DIAGNOSIS — I2510 Atherosclerotic heart disease of native coronary artery without angina pectoris: Secondary | ICD-10-CM | POA: Diagnosis present

## 2022-11-22 DIAGNOSIS — K648 Other hemorrhoids: Secondary | ICD-10-CM | POA: Diagnosis present

## 2022-11-22 DIAGNOSIS — E785 Hyperlipidemia, unspecified: Secondary | ICD-10-CM | POA: Diagnosis present

## 2022-11-22 LAB — COMPREHENSIVE METABOLIC PANEL
ALT: 154 U/L — ABNORMAL HIGH (ref 0–44)
AST: 110 U/L — ABNORMAL HIGH (ref 15–41)
Albumin: 3.1 g/dL — ABNORMAL LOW (ref 3.5–5.0)
Alkaline Phosphatase: 141 U/L — ABNORMAL HIGH (ref 38–126)
Anion gap: 8 (ref 5–15)
BUN: 6 mg/dL (ref 6–20)
CO2: 23 mmol/L (ref 22–32)
Calcium: 8.3 mg/dL — ABNORMAL LOW (ref 8.9–10.3)
Chloride: 106 mmol/L (ref 98–111)
Creatinine, Ser: 0.62 mg/dL (ref 0.44–1.00)
GFR, Estimated: >60 mL/min (ref >60)
Glucose, Bld: 114 mg/dL — ABNORMAL HIGH (ref 70–99)
Potassium: 3.6 mmol/L (ref 3.5–5.1)
Sodium: 137 mmol/L (ref 135–145)
Total Bilirubin: 0.9 mg/dL (ref 0.3–1.2)
Total Protein: 6.3 g/dL — ABNORMAL LOW (ref 6.5–8.1)

## 2022-11-22 LAB — ABO/RH: ABO/RH(D): O POS

## 2022-11-22 LAB — TYPE AND SCREEN
ABO/RH(D): O POS
Antibody Screen: NEGATIVE

## 2022-11-22 MED ORDER — BUPROPION HCL ER (SR) 150 MG PO TB12
300.0000 mg | ORAL_TABLET | Freq: Every day | ORAL | Status: DC
  Administered 2022-11-22 – 2022-11-26 (×5): 300 mg via ORAL
  Filled 2022-11-22 (×6): qty 2

## 2022-11-22 MED ORDER — LACTATED RINGERS IV SOLN: INTRAVENOUS | Status: DC

## 2022-11-22 MED ORDER — ATORVASTATIN CALCIUM 10 MG PO TABS: 20.0000 mg | ORAL_TABLET | Freq: Every evening | ORAL | Status: DC

## 2022-11-22 MED ORDER — MELATONIN 3 MG PO TABS: 3.0000 mg | ORAL_TABLET | Freq: Every evening | ORAL | Status: DC | PRN

## 2022-11-22 MED ORDER — FENTANYL CITRATE PF 50 MCG/ML IJ SOSY
12.5000 ug | PREFILLED_SYRINGE | INTRAMUSCULAR | Status: DC | PRN
  Administered 2022-11-22 (×2): 12.5 ug via INTRAVENOUS
  Filled 2022-11-22 (×2): qty 1

## 2022-11-22 MED ORDER — ACETAMINOPHEN 650 MG RE SUPP: 650.0000 mg | Freq: Four times a day (QID) | RECTAL | Status: DC | PRN

## 2022-11-22 MED ORDER — ASPIRIN 81 MG PO TBEC
81.0000 mg | DELAYED_RELEASE_TABLET | Freq: Every evening | ORAL | Status: DC
  Administered 2022-11-22 – 2022-11-27 (×6): 81 mg via ORAL
  Filled 2022-11-22 (×8): qty 1

## 2022-11-22 MED ORDER — HYDROMORPHONE HCL 1 MG/ML IJ SOLN: 0.5000 mg | INTRAMUSCULAR | Status: DC | PRN

## 2022-11-22 MED ORDER — MELATONIN 5 MG PO TABS: 5.0000 mg | ORAL_TABLET | Freq: Every evening | ORAL | Status: DC | PRN

## 2022-11-22 MED ORDER — METOPROLOL SUCCINATE ER 25 MG PO TB24
25.0000 mg | ORAL_TABLET | Freq: Every evening | ORAL | Status: DC
  Administered 2022-11-22 – 2022-11-27 (×6): 25 mg via ORAL
  Filled 2022-11-22 (×6): qty 1

## 2022-11-22 MED ORDER — NIFEDIPINE ER OSMOTIC RELEASE 30 MG PO TB24
30.0000 mg | ORAL_TABLET | Freq: Every evening | ORAL | Status: DC
  Administered 2022-11-22 – 2022-11-25 (×4): 30 mg via ORAL
  Filled 2022-11-22 (×5): qty 1

## 2022-11-22 MED ORDER — MELATONIN 3 MG PO TABS: 1.5000 mg | ORAL_TABLET | Freq: Every evening | ORAL | Status: DC | PRN

## 2022-11-22 MED ORDER — FENTANYL CITRATE PF 50 MCG/ML IJ SOSY
50.0000 ug | PREFILLED_SYRINGE | Freq: Once | INTRAMUSCULAR | Status: AC
  Administered 2022-11-22: 50 ug via INTRAVENOUS
  Filled 2022-11-22: qty 1

## 2022-11-22 MED ORDER — HYDROMORPHONE HCL 1 MG/ML IJ SOLN
1.0000 mg | INTRAMUSCULAR | Status: DC | PRN
  Administered 2022-11-22 – 2022-11-26 (×4): 1 mg via INTRAVENOUS
  Filled 2022-11-22 (×4): qty 1

## 2022-11-22 MED ORDER — HYDROMORPHONE HCL 1 MG/ML IJ SOLN
1.0000 mg | INTRAMUSCULAR | Status: DC | PRN
  Administered 2022-11-22: 1 mg via INTRAVENOUS
  Filled 2022-11-22: qty 1

## 2022-11-22 MED ORDER — ACETAMINOPHEN 325 MG PO TABS
650.0000 mg | ORAL_TABLET | Freq: Four times a day (QID) | ORAL | Status: DC | PRN
  Administered 2022-11-22 – 2022-11-23 (×2): 650 mg via ORAL
  Filled 2022-11-22 (×3): qty 2

## 2022-11-22 MED ORDER — NICOTINE 14 MG/24HR TD PT24
14.0000 mg | MEDICATED_PATCH | Freq: Every day | TRANSDERMAL | Status: DC
  Administered 2022-11-22 – 2022-11-27 (×6): 14 mg via TRANSDERMAL
  Filled 2022-11-22 (×6): qty 1

## 2022-11-22 MED ORDER — ENOXAPARIN SODIUM 40 MG/0.4ML IJ SOSY
40.0000 mg | PREFILLED_SYRINGE | Freq: Every day | INTRAMUSCULAR | Status: DC
  Administered 2022-11-22 – 2022-11-27 (×6): 40 mg via SUBCUTANEOUS
  Filled 2022-11-22 (×6): qty 0.4

## 2022-11-22 MED ORDER — OXYCODONE-ACETAMINOPHEN 5-325 MG PO TABS
1.0000 | ORAL_TABLET | ORAL | Status: DC | PRN
  Administered 2022-11-23 – 2022-11-27 (×7): 2 via ORAL
  Administered 2022-11-27: 1 via ORAL
  Filled 2022-11-22 (×6): qty 2
  Filled 2022-11-22: qty 1
  Filled 2022-11-22: qty 2

## 2022-11-22 MED ORDER — HYDROXYZINE HCL 25 MG PO TABS
50.0000 mg | ORAL_TABLET | Freq: Every day | ORAL | Status: DC
  Administered 2022-11-22 – 2022-11-26 (×5): 50 mg via ORAL
  Filled 2022-11-22 (×5): qty 2

## 2022-11-22 MED ORDER — ONDANSETRON HCL 4 MG/2ML IJ SOLN
4.0000 mg | Freq: Four times a day (QID) | INTRAMUSCULAR | Status: DC | PRN
  Administered 2022-11-22 – 2022-11-26 (×2): 4 mg via INTRAVENOUS
  Filled 2022-11-22 (×2): qty 2

## 2022-11-22 MED ORDER — TRAZODONE HCL 50 MG PO TABS
50.0000 mg | ORAL_TABLET | Freq: Every day | ORAL | Status: DC
  Administered 2022-11-22 – 2022-11-26 (×5): 50 mg via ORAL
  Filled 2022-11-22 (×5): qty 1

## 2022-11-22 NOTE — ED Notes (Signed)
ED TO INPATIENT HANDOFF REPORT  ED Nurse Name and Phone #: Lanora Manis [phone]  S Name/Age/Gender Sheila Walker 52 y.o. female Room/Bed: 026C/026C  Code Status   Code Status: Not on file  Home/SNF/Other Home Patient oriented to: self, place, time, and situation Is this baseline? Yes   Triage Complete: Triage complete  Chief Complaint Acute diverticulitis of intestine [K57.92]  Triage Note Pt sent by PCP for abscess inside of LLQ of abdomen from CT results. Pt c/o LLQ abdominal painx4d. Pt went to [facility] to get CT done.   Allergies Allergies  Allergen Reactions   Amlodipine Swelling    Patient also reports increased heart rate Patient also reports increased heart rate    Azithromycin Hives   Lisinopril Anaphylaxis    Cough Cough    Isosorbide Nitrate     Migraine with nausea    Etodolac Rash    Level of Care/Admitting Diagnosis ED Disposition     ED Disposition  Admit   Condition  --   Comment  Hospital Area: [facility] [[id number]]  Level of Care: Med-Surg [16]  May admit patient to [facility] or [facility] if equivalent level of care is available:: Yes  Covid Evaluation: Asymptomatic - no recent exposure (last 10 days) testing not required  Diagnosis: Acute diverticulitis of intestine [[id number]]  Admitting Physician: Gery Pray [[id number]]  Attending Physician: Gery Pray [[id number]]  Certification:: I certify this patient will need inpatient services for at least 2 midnights  Expected Medical Readiness: 11/24/2022          B Medical/Surgery History Past Medical History:  Diagnosis Date   Allergy    Anxiety    Arthritis    Cancer of kidney (HCC)    Hyperlipidemia    Hypertension    Palpitations    Sleep apnea    CPAP   Past Surgical History:  Procedure Laterality Date   COLONOSCOPY  3 years ago    W/Dr.Gupta   EXCISION OF TONGUE LESION N/A 10/15/2019   Procedure: EXCISION OF EPIGLOTTIC;  Surgeon: Linus Salmons, MD;  Location: [facility];  Service: ENT;  Laterality: N/A;   MICROLARYNGOSCOPY N/A 10/15/2019   Procedure: MICROLARYNGOSCOPY with excision of epiglottic cyst;  Surgeon: Linus Salmons, MD;  Location: [facility];  Service: ENT;  Laterality: N/A;   PARTIAL NEPHRECTOMY     Left   WRIST FRACTURE SURGERY       A IV Location/Drains/Wounds Patient Lines/Drains/Airways Status     Active Line/Drains/Airways     Name Placement date Placement time Site Days   Peripheral IV 11/22/22 20 G Anterior;Distal;Right Forearm 11/22/22  0018  Forearm  less than 1            Intake/Output Last 24 hours  Intake/Output Summary (Last 24 hours) at 11/22/2022 0253 Last data filed at 11/22/2022 0252 Gross per 24 hour  Intake 678.77 ml  Output --  Net 678.77 ml    Labs/Imaging Results for orders placed or performed during the hospital encounter of 11/21/22 (from the past 48 hour(s))  Lipase, blood     Status: None   Collection Time: 11/21/22  6:33 PM  Result Value Ref Range   Lipase 23 11 - 51 U/L    Comment: Performed at [facility], [address], [place], [place] [address]  Comprehensive metabolic panel     Status: Abnormal   Collection Time: 11/21/22  6:33 PM  Result Value Ref Range   Sodium  134 (L) 135 - 145 mmol/L   Potassium 3.9 3.5 - 5.1 mmol/L   Chloride 102 98 - 111 mmol/L   CO2 21 (L) 22 - 32 mmol/L   Glucose, Bld 127 (H) 70 - 99 mg/dL    Comment: Glucose reference range applies only to samples taken after fasting for at least 8 hours.   BUN 7 6 - 20 mg/dL   Creatinine, Ser 0.98 0.44 - 1.00 mg/dL   Calcium 9.1 8.9 - 11.9 mg/dL   Total Protein 7.2 6.5 - 8.1 g/dL   Albumin 3.7 3.5 - 5.0 g/dL   AST 14 (L) 15 - 41 U/L   ALT 13 0 - 44 U/L   Alkaline Phosphatase 75 38 - 126 U/L   Total Bilirubin 0.5 0.3 - 1.2 mg/dL   GFR, Estimated >14 >78 mL/min    Comment: (NOTE) Calculated using the CKD-EPI Creatinine Equation (2021)    Anion gap 11 5 - 15     Comment: Performed at [facility], [address] [place], [place] [address]  CBC     Status: Abnormal   Collection Time: 11/21/22  6:33 PM  Result Value Ref Range   WBC 16.2 (H) 4.0 - 10.5 K/uL   RBC 4.76 3.87 - 5.11 MIL/uL   Hemoglobin 13.9 12.0 - 15.0 g/dL   HCT 13.0 86.5 - 78.4 %   MCV 91.0 80.0 - 100.0 fL   MCH 29.2 26.0 - 34.0 pg   MCHC 32.1 30.0 - 36.0 g/dL   RDW 69.6 29.5 - 28.4 %   Platelets 272 150 - 400 K/uL   nRBC 0.0 0.0 - 0.2 %    Comment: Performed at [facility], [address], [place], [place] [address]  Type and screen [facility]     Status: None   Collection Time: 11/22/22 12:03 AM  Result Value Ref Range   ABO/RH(D) O POS    Antibody Screen NEG    Sample Expiration      11/25/2022,2359 Performed at [facility], [address] [place], [place] [address]   ABO/Rh     Status: None   Collection Time: 11/22/22 12:16 AM  Result Value Ref Range   ABO/RH(D)      O POS Performed at [facility], [address] [place], [place] [address]    No results found.  Pending Labs Unresulted Labs (From admission, onward)     Start     Ordered   11/21/22 1833  Urinalysis, Routine w reflex microscopic -Urine, Clean Catch  Once,   URGENT       Question:  Specimen Source  Answer:  Urine, Clean Catch   11/21/22 1832            Vitals/Pain Today's Vitals   11/21/22 2315 11/21/22 2323 11/22/22 0057 11/22/22 0223  BP: 133/87     Pulse: 99     Resp:      Temp:      TempSrc:      SpO2: 98% 98%    Weight:      Height:      PainSc:   10-Worst pain ever 10-Worst pain ever    Isolation Precautions No active isolations  Medications Medications  piperacillin-tazobactam (ZOSYN) IVPB 3.375 g (0 g Intravenous Stopped 11/22/22 0223)  ondansetron (ZOFRAN) injection 4 mg (has no administration in time range)  HYDROmorphone (DILAUDID) injection 0.5 mg (has no administration in time range)  nicotine (  NICODERM CQ - dosed  in mg/24 hours) patch 14 mg (has no administration in time range)  sodium chloride 0.9 % bolus 1,000 mL (0 mLs Intravenous Stopped 11/22/22 0252)  ondansetron (ZOFRAN) injection 4 mg (4 mg Intravenous Given 11/22/22 0018)  HYDROmorphone (DILAUDID) injection 1 mg (1 mg Intravenous Given 11/22/22 0021)  fentaNYL (SUBLIMAZE) injection 50 mcg (50 mcg Intravenous Given 11/22/22 0139)    Mobility walks     Focused Assessments Cardiac Assessment Handoff:    No results found for: "CKTOTAL", "CKMB", "CKMBINDEX", "TROPONINI" No results found for: "DDIMER" Does the Patient currently have chest pain? No    R Recommendations: See Admitting Provider Note  Report given to:   Additional Notes: A & o x 4. Super nice

## 2022-11-22 NOTE — H&P (Signed)
PCP:   Tacey Ruiz, FNP   Chief Complaint:  Diverticulitis with small abscess  HPI: This is a 52 year old female with past medical history of HLD, HTN, OSA, tobacco use.  For the past 2 weeks she has noticed mild abdominal soreness.  She has ovarian cysts and thought the discomfort was related to this.  On Monday she developed bilateral lower quadrant cramping.  She thought it was related to her menses and gas.  Over the course of the next 2 days her cramping persisted until it became sharp, searing, stabbing pain.  On Wednesday she had no sleep as a result.  Thursday morning she saw her PCP who ordered a CT abdomen which showed diverticulitis with possible small abscess formation.  She was directed to the ER.  The patient endorses nausea but no vomiting.  She endorses chills and a low-grade fever.  Review of Systems:  Per HPI  Past Medical History: Past Medical History:  Diagnosis Date   Allergy    Anxiety    Arthritis    Cancer of kidney (HCC)    Hyperlipidemia    Hypertension    Palpitations    Sleep apnea    CPAP   Past Surgical History:  Procedure Laterality Date   COLONOSCOPY  3 years ago    W/Dr.Gupta   EXCISION OF TONGUE LESION N/A 10/15/2019   Procedure: EXCISION OF EPIGLOTTIC;  Surgeon: Linus Salmons, MD;  Location: [facility];  Service: ENT;  Laterality: N/A;   MICROLARYNGOSCOPY N/A 10/15/2019   Procedure: MICROLARYNGOSCOPY with excision of epiglottic cyst;  Surgeon: Linus Salmons, MD;  Location: [facility];  Service: ENT;  Laterality: N/A;   PARTIAL NEPHRECTOMY     Left   WRIST FRACTURE SURGERY      Medications: Prior to Admission medications   Medication Sig Start Date End Date Taking? Authorizing Provider  aspirin EC 81 MG tablet Take 81 mg by mouth every evening. Swallow whole.   Yes [provider]  atorvastatin (LIPITOR) 20 MG tablet Take 20 mg by mouth every evening.   Yes [provider]  buPROPion (WELLBUTRIN  SR) 150 MG 12 hr tablet Take 300 mg by mouth at bedtime. 04/18/21  Yes [provider]  CALCIUM PO Take 1 tablet by mouth every evening.   Yes [provider]  cyclobenzaprine (FLEXERIL) 5 MG tablet TAKE 1 TABLET(5 MG) BY MOUTH THREE TIMES DAILY AS NEEDED FOR MUSCLE SPASMS 06/14/21  Yes [provider]  hydrOXYzine (ATARAX) 25 MG tablet Take 50 mg by mouth at bedtime. 09/15/20  Yes [provider]  magnesium 30 MG tablet Take 30 mg by mouth every evening.   Yes [provider]  Melatonin 3 MG TBDP Take 1 tablet by mouth at bedtime as needed. 08/14/16  Yes [provider]  meloxicam (MOBIC) 7.5 MG tablet Take 7.5 mg by mouth every evening. 04/20/21  Yes [provider]  metoprolol succinate (TOPROL-XL) 25 MG 24 hr tablet TAKE 1 TABLET(25 MG) BY MOUTH IN THE MORNING AND AT BEDTIME Patient taking differently: Take 25 mg by mouth every evening. 09/28/21  Yes Rollene Rotunda, MD  NIFEdipine (PROCARDIA-XL/NIFEDICAL-XL) 30 MG 24 hr tablet Take 30 mg by mouth every evening. 07/30/19  Yes [provider]  Omega-3 Fatty Acids (FISH OIL) 300 MG CAPS Take 1 capsule by mouth every evening.   Yes [provider]  polyethylene glycol powder (GLYCOLAX/MIRALAX) 17 GM/SCOOP powder Take 0.5 Containers by mouth daily as needed for moderate constipation.  08/26/17  Yes [provider]  traZODone (DESYREL) 50 MG tablet Take 50 mg by mouth at bedtime. 03/21/22  Yes [provider]  atorvastatin (LIPITOR) 10 MG tablet Take 1 tablet (10 mg total) by mouth daily. 09/09/19 09/08/20  Rollene Rotunda, MD    Allergies:   Allergies  Allergen Reactions   Amlodipine Swelling    Patient also reports increased heart rate Patient also reports increased heart rate    Azithromycin Hives   Lisinopril Anaphylaxis    Cough Cough    Isosorbide Nitrate     Migraine with nausea    Etodolac Rash    Social History:  reports that she has been  smoking cigarettes. She has never used smokeless tobacco. She reports current drug use. Drug: Marijuana. She reports that she does not drink alcohol.  Family History: Family History  Problem Relation Age of Onset   Colon cancer Maternal Uncle 40   Colon cancer Maternal Grandfather 60   Esophageal cancer Neg Hx    Stomach cancer Neg Hx     Physical Exam: Vitals:   11/21/22 2258 11/21/22 2300 11/21/22 2315 11/21/22 2323  BP: (!) 129/99 118/86 133/87   Pulse: (!) 108 (!) 103 99   Resp: 17     Temp: 97.8 F (36.6 C)     TempSrc: Oral     SpO2: 98% 97% 98% 98%  Weight:      Height:        General:  Alert and oriented times three, well developed and nourished, no acute distress Eyes: PERRLA, pink conjunctiva, no scleral icterus ENT: Moist oral mucosa, neck supple, no thyromegaly Lungs: clear to ascultation, no wheeze, no crackles, no use of accessory muscles Cardiovascular: regular rate and rhythm, no regurgitation, no gallops, no murmurs. No carotid bruits, no JVD Abdomen: soft, positive BS, positive TTP LLQ>RLQ, no organomegaly, not an acute abdomen GU: not examined Neuro: CN II - XII grossly intact, sensation intact Musculoskeletal: strength 5/5 all extremities, no clubbing, cyanosis or edema Skin: no rash, no subcutaneous crepitation, no decubitus Psych: appropriate patient   Labs on Admission:  Recent Labs    11/21/22 1833  NA 134*  K 3.9  CL 102  CO2 21*  GLUCOSE 127*  BUN 7  CREATININE 0.71  CALCIUM 9.1   Recent Labs    11/21/22 1833  AST 14*  ALT 13  ALKPHOS 75  BILITOT 0.5  PROT 7.2  ALBUMIN 3.7   Recent Labs    11/21/22 1833  LIPASE 23   Recent Labs    11/21/22 1833  WBC 16.2*  HGB 13.9  HCT 43.3  MCV 91.0  PLT 272     Radiological Exams on Admission: No results found.  Assessment/Plan Present on Admission:  Acute sigmoid diverticulitis, concern for small intramural abscess -Blood cultures x2 collected -Patient received IV Zofran  in the ER, will continue -N.p.o., IV fluid hydration -As needed pain and antiemetic meds -Surgical consult placed by EDP -Repeat CT abdomen pelvis not done  Dyslipidemia -Statin on hold   Hypertension, benign -Metoprolol, nifedipine resumed   OSA (obstructive sleep apnea) -CPAP cure sleep ordered   Tobacco use disorder -nicotine patch  Shir Bergman 11/22/2022, 2:24 AM

## 2022-11-22 NOTE — Progress Notes (Signed)
[facility]                                                                               Sheila Walker, is a 52 y.o. female, DOB - 1970/12/23, CBJ:[id number] Admit date - 11/21/2022    Outpatient Primary MD for the patient is Dolleschel, Irving Burton, FNP  LOS - 0  days    Brief summary   52 year old female with past medical history of HLD, HTN, OSA, tobacco use. For the past 2 weeks she has noticed abd pain with some nausea . No vomiting. She underwent CT abd and pelvis showing diverticulitis with possible small abscess formation. She was directed to the ER.  Gen surgery consulted, recommended to watch with IV antibiotics, IV fluids.   Assessment & Plan    Assessment and Plan:  Acute sigmoid diverticulitis, concern for small intramural abscess  Conservative management with IV fluids, iv antibiotics, anti emetics.  Gen surgery consulted. NPO.     Elevated liver enzymes:  US liver ordered.  Statin on hold.    Hypertension:  Well controlled.       Estimated body mass index is 33.22 kg/m as calculated from the following:   Height as of this encounter: 5\' 7"  (1.702 m).   Weight as of this encounter: 96.2 kg.  Code Status: full code DVT Prophylaxis:  enoxaparin (LOVENOX) injection 40 mg Start: 11/22/22 1000   Level of Care: Level of care: Med-Surg Family Communication: none at bedside  Disposition Plan:     Remains inpatient appropriate:    Procedures:  None.   Consultants:   General surgery.   Antimicrobials:   Anti-infectives (From admission, onward)    Start     Dose/Rate Route Frequency Ordered Stop   11/21/22 2330  piperacillin-tazobactam (ZOSYN) IVPB 3.375 g        3.375 g 12.5 mL/hr over 240 Minutes Intravenous Every 8 hours 11/21/22 2327          Medications  Scheduled Meds:  aspirin EC  81 mg Oral QPM   buPROPion  300 mg Oral QHS   enoxaparin (LOVENOX) injection  40 mg Subcutaneous Daily   hydrOXYzine  50 mg Oral QHS    metoprolol succinate  25 mg Oral QPM   nicotine  14 mg Transdermal Daily   NIFEdipine  30 mg Oral QPM   traZODone  50 mg Oral QHS   Continuous Infusions:  piperacillin-tazobactam (ZOSYN)  IV 3.375 g (11/22/22 0817)   PRN Meds:.acetaminophen **OR** acetaminophen, fentaNYL (SUBLIMAZE) injection, melatonin, ondansetron (ZOFRAN) IV    Subjective:   Sheila Walker was seen and examined today. Abd pain and nausea.   Objective:   Vitals:   11/21/22 2315 11/21/22 2323 11/22/22 0322 11/22/22 0819  BP: 133/87  109/73 112/86  Pulse: 99  93 80  Resp:   18 17  Temp:   98.1 F (36.7 C) 97.7 F (36.5 C)  TempSrc:   Oral Oral  SpO2: 98% 98% 97% 99%  Weight:      Height:        Intake/Output Summary (Last 24 hours) at 11/22/2022 1301 Last data filed at 11/22/2022 0900 Gross per 24 hour  Intake  678.77 ml  Output --  Net 678.77 ml   Filed Weights   11/21/22 1830  Weight: 96.2 kg     Exam General: Alert and oriented x 3, NAD Cardiovascular: S1 S2 auscultated, no murmurs, RRR Respiratory: Clear to auscultation bilaterally, no wheezing, rales or rhonchi Gastrointestinal: Soft, nontender, nondistended, + bowel sounds Ext: no pedal edema bilaterally Neuro: AAOx3, Cr N's II- XII. Strength 5/5 upper and lower extremities bilaterally Skin: No rashes Psych: Normal affect and demeanor, alert and oriented x3    Data Reviewed:  I have personally reviewed following labs and imaging studies   CBC Lab Results  Component Value Date   WBC 16.2 (H) 11/21/2022   RBC 4.76 11/21/2022   HGB 13.9 11/21/2022   HCT 43.3 11/21/2022   MCV 91.0 11/21/2022   MCH 29.2 11/21/2022   PLT 272 11/21/2022   MCHC 32.1 11/21/2022   RDW 13.9 11/21/2022   LYMPHSABS 1.7 10/03/2019   MONOABS 1.2 (H) 10/03/2019   EOSABS 0.2 10/03/2019   BASOSABS 0.1 10/03/2019     Last metabolic panel Lab Results  Component Value Date   NA 137 11/22/2022   K 3.6 11/22/2022   CL 106 11/22/2022   CO2 23 11/22/2022    BUN 6 11/22/2022   CREATININE 0.62 11/22/2022   GLUCOSE 114 (H) 11/22/2022   GFRNONAA >60 11/22/2022   GFRAA >60 10/03/2019   CALCIUM 8.3 (L) 11/22/2022   PROT 6.3 (L) 11/22/2022   ALBUMIN 3.1 (L) 11/22/2022   BILITOT 0.9 11/22/2022   ALKPHOS 141 (H) 11/22/2022   AST 110 (H) 11/22/2022   ALT 154 (H) 11/22/2022   ANIONGAP 8 11/22/2022    CBG (last 3)  No results for input(s): "GLUCAP" in the last 72 hours.    Coagulation Profile: No results for input(s): "INR", "PROTIME" in the last 168 hours.   Radiology Studies: No results found.     Kathlen Mody M.D. [facility] 11/22/2022, 1:01 PM  Available via Epic secure chat 7am-7pm After 7 pm, please refer to night coverage provider listed on amion.

## 2022-11-22 NOTE — Plan of Care (Signed)

## 2022-11-22 NOTE — Progress Notes (Signed)
Patient complaining of abdominal pain. Fentanyl had already been given; next dose not due yet. MD notified. New order given.

## 2022-11-22 NOTE — Progress Notes (Signed)
Patient requested CPAP.  Equipment unavailable at this time.  RN aware.

## 2022-11-22 NOTE — Progress Notes (Signed)
   11/22/22 2347  BiPAP/CPAP/SIPAP  BiPAP/CPAP/SIPAP Pt Type Adult  Reason BIPAP/CPAP not in use Non-compliant (refused, only tolerates nasal pillows)   Patient wanted CPAP, CPAP brought to the room, patient stated she only can use nasal pillows. Refused CPAP for the evening.

## 2022-11-22 NOTE — Consult Note (Signed)
**Note Sheila-Identified via Obfuscation** Reason for Consult:abdominal pain Referring Provider: April Walker  Sheila Walker is an 52 y.o. female.  HPI: 52 yo female 1 day of intense abdominal pain. Pain is in the lower abdomen. It came on suddenly. She went to her PCP and got a CT and was told to come here. Pain ahs improved with dilaudid though that med gives her RUQ cramps. She has never had diverticulitis before but knew she had diverticulosis. She smokes and tries to watch what she eats for the diverticulosis citing oat cereal and concerning.  Past Medical History:  Diagnosis Date   Allergy    Anxiety    Arthritis    Cancer of kidney (HCC)    Hyperlipidemia    Hypertension    Palpitations    Sleep apnea    CPAP    Past Surgical History:  Procedure Laterality Date   COLONOSCOPY  3 years ago    W/SheilaGupta   EXCISION OF TONGUE LESION N/A 10/15/2019   Procedure: EXCISION OF EPIGLOTTIC;  Surgeon: Sheila Salmons, MD;  Location: [facility];  Service: ENT;  Laterality: N/A;   MICROLARYNGOSCOPY N/A 10/15/2019   Procedure: MICROLARYNGOSCOPY with excision of epiglottic cyst;  Surgeon: Sheila Salmons, MD;  Location: [facility];  Service: ENT;  Laterality: N/A;   PARTIAL NEPHRECTOMY     Left   WRIST FRACTURE SURGERY      Family History  Problem Relation Age of Onset   Colon cancer Maternal Uncle 60   Colon cancer Maternal Grandfather 60   Esophageal cancer Neg Hx    Stomach cancer Neg Hx     Social History:  reports that she has been smoking cigarettes. She has never used smokeless tobacco. She reports current drug use. Drug: Marijuana. She reports that she does not drink alcohol.  Allergies:  Allergies  Allergen Reactions   Amlodipine Swelling    Patient also reports increased heart rate Patient also reports increased heart rate    Azithromycin Hives   Lisinopril Anaphylaxis    Cough Cough    Isosorbide Nitrate     Migraine with nausea    Etodolac Rash    Medications: I  have reviewed the patient's current medications.  Results for orders placed or performed during the hospital encounter of 11/21/22 (from the past 48 hour(s))  Lipase, blood     Status: None   Collection Time: 11/21/22  6:33 PM  Result Value Ref Range   Lipase 23 11 - 51 U/L    Comment: Performed at [facility], [address], [place], [place] [address]  Comprehensive metabolic panel     Status: Abnormal   Collection Time: 11/21/22  6:33 PM  Result Value Ref Range   Sodium 134 (L) 135 - 145 mmol/L   Potassium 3.9 3.5 - 5.1 mmol/L   Chloride 102 98 - 111 mmol/L   CO2 21 (L) 22 - 32 mmol/L   Glucose, Bld 127 (H) 70 - 99 mg/dL    Comment: Glucose reference range applies only to samples taken after fasting for at least 8 hours.   BUN 7 6 - 20 mg/dL   Creatinine, Ser 6.21 0.44 - 1.00 mg/dL   Calcium 9.1 8.9 - 30.8 mg/dL   Total Protein 7.2 6.5 - 8.1 g/dL   Albumin 3.7 3.5 - 5.0 g/dL   AST 14 (L) 15 - 41 U/L   ALT 13 0 - 44 U/L   Alkaline Phosphatase 75 38 - 126 U/L  Total Bilirubin 0.5 0.3 - 1.2 mg/dL   GFR, Estimated >96 >29 mL/min    Comment: (NOTE) Calculated using the CKD-EPI Creatinine Equation (2021)    Anion gap 11 5 - 15    Comment: Performed at [facility], [address], [place], [place] [address]  CBC     Status: Abnormal   Collection Time: 11/21/22  6:33 PM  Result Value Ref Range   WBC 16.2 (H) 4.0 - 10.5 K/uL   RBC 4.76 3.87 - 5.11 MIL/uL   Hemoglobin 13.9 12.0 - 15.0 g/dL   HCT 32.4 40.1 - 02.7 %   MCV 91.0 80.0 - 100.0 fL   MCH 29.2 26.0 - 34.0 pg   MCHC 32.1 30.0 - 36.0 g/dL   RDW 25.3 66.4 - 40.3 %   Platelets 272 150 - 400 K/uL   nRBC 0.0 0.0 - 0.2 %    Comment: Performed at [facility], [address], [place], [place] [address]  Type and screen [facility]     Status: None   Collection Time: 11/22/22 12:03 AM  Result Value Ref Range   ABO/RH(D) O POS    Antibody Screen NEG    Sample Expiration       11/25/2022,2359 Performed at [facility], [address], [place], [place] [address]   ABO/Rh     Status: None   Collection Time: 11/22/22 12:16 AM  Result Value Ref Range   ABO/RH(D)      O POS Performed at [facility], [address], [place], [place] [address]     No results found.  ROS  PE Blood pressure 133/87, pulse 99, temperature 97.8 F (36.6 C), temperature source Oral, resp. rate 17, height 5\' 7"  (1.702 m), weight 96.2 kg, last menstrual period 08/09/2019, SpO2 98%. Constitutional: NAD; conversant; no deformities Eyes: Moist conjunctiva; no lid lag; anicteric; PERRL Neck: Trachea midline; no thyromegaly Lungs: Normal respiratory effort; no tactile fremitus CV: RRR; no palpable thrills; no pitting edema GI: Abd soft, tender in lower abdomen; no palpable hepatosplenomegaly MSK: Normal gait; no clubbing/cyanosis Psychiatric: Appropriate affect; alert and oriented x3 Lymphatic: No palpable cervical or axillary lymphadenopathy Skin: No major subcutaneous nodules. Warm and dry   Assessment/Plan: 52 yo female with first bout of diverticulitis, 1.1 cm abscess on OSH CT  Admit to hospital IV abs Bowel rest Pain control No need to repeat CT scan at this time as such a small abscess would likely not be drainable or need drainage anyway. Follow clinically We discussed possible need to repeat if she doesn't resolve, possible IR drain, and possible ostomy  I reviewed last 24 h vitals and pain scores, last 48 h intake and output, last 24 h labs and trends, and last 24 h imaging results.  This care required high  level of medical decision making.   Sheila Walker 11/22/2022, 2:23 AM

## 2022-11-23 DIAGNOSIS — K5792 Diverticulitis of intestine, part unspecified, without perforation or abscess without bleeding: Secondary | ICD-10-CM | POA: Diagnosis not present

## 2022-11-23 DIAGNOSIS — I10 Essential (primary) hypertension: Secondary | ICD-10-CM | POA: Diagnosis not present

## 2022-11-23 LAB — HEPATIC FUNCTION PANEL
ALT: 382 U/L — ABNORMAL HIGH (ref 0–44)
AST: 187 U/L — ABNORMAL HIGH (ref 15–41)
Albumin: 3.2 g/dL — ABNORMAL LOW (ref 3.5–5.0)
Alkaline Phosphatase: 260 U/L — ABNORMAL HIGH (ref 38–126)
Bilirubin, Direct: 0.1 mg/dL (ref 0.0–0.2)
Indirect Bilirubin: 0.4 mg/dL (ref 0.3–0.9)
Total Bilirubin: 0.5 mg/dL (ref 0.3–1.2)
Total Protein: 7 g/dL (ref 6.5–8.1)

## 2022-11-23 LAB — CBC WITH DIFFERENTIAL/PLATELET
Abs Immature Granulocytes: 0.04 10*3/uL (ref 0.00–0.07)
Basophils Absolute: 0 10*3/uL (ref 0.0–0.1)
Basophils Relative: 1 %
Eosinophils Absolute: 0.3 10*3/uL (ref 0.0–0.5)
Eosinophils Relative: 3 %
HCT: 39.3 % (ref 36.0–46.0)
Hemoglobin: 12.5 g/dL (ref 12.0–15.0)
Immature Granulocytes: 1 %
Lymphocytes Relative: 14 %
Lymphs Abs: 1.1 10*3/uL (ref 0.7–4.0)
MCH: 28.5 pg (ref 26.0–34.0)
MCHC: 31.8 g/dL (ref 30.0–36.0)
MCV: 89.5 fL (ref 80.0–100.0)
Monocytes Absolute: 0.6 10*3/uL (ref 0.1–1.0)
Monocytes Relative: 8 %
Neutro Abs: 5.8 10*3/uL (ref 1.7–7.7)
Neutrophils Relative %: 73 %
Platelets: 228 10*3/uL (ref 150–400)
RBC: 4.39 MIL/uL (ref 3.87–5.11)
RDW: 13.8 % (ref 11.5–15.5)
WBC: 7.9 10*3/uL (ref 4.0–10.5)
nRBC: 0 % (ref 0.0–0.2)

## 2022-11-23 NOTE — Progress Notes (Signed)
[facility]                                                                               Sheila Walker, is a 52 y.o. female, DOB - April 25, 1970, ZSW:[id number] Admit date - 11/21/2022    Outpatient Primary MD for the patient is Dolleschel, Irving Burton, FNP  LOS - 1  days    Brief summary   52 year old female with past medical history of HLD, HTN, OSA, tobacco use. For the past 2 weeks she has noticed abd pain with some nausea . No vomiting. She underwent CT abd and pelvis showing diverticulitis with possible small abscess formation. She was directed to the ER.  Gen surgery consulted, recommended to watch with IV antibiotics, IV fluids.   Assessment & Plan    Assessment and Plan:  Acute sigmoid diverticulitis, concern for small intramural abscess  Conservative management with IV fluids, iv antibiotics, anti emetics.  Gen surgery consulted. Started on clears and advance as per the general surgeon.  - wbc count wnl. Remains afebrile,  - recommend getting out of bed and ambulate.     Elevated liver enzymes:  [place] Upper quadrant unremarkable.  Statin on hold.    Hypertension:  Well controlled.       Estimated body mass index is 33.22 kg/m as calculated from the following:   Height as of this encounter: 5\' 7"  (1.702 m).   Weight as of this encounter: 96.2 kg.  Code Status: full code DVT Prophylaxis:  enoxaparin (LOVENOX) injection 40 mg Start: 11/22/22 1000   Level of Care: Level of care: Med-Surg Family Communication: none at bedside  Disposition Plan:     Remains inpatient appropriate:  IV antibiotics.   Procedures:  None.   Consultants:   General surgery.   Antimicrobials:   Anti-infectives (From admission, onward)    Start     Dose/Rate Route Frequency Ordered Stop   11/21/22 2330  piperacillin-tazobactam (ZOSYN) IVPB 3.375 g        3.375 g 12.5 mL/hr over 240 Minutes Intravenous Every 8 hours 11/21/22 2327           Medications  Scheduled Meds:  aspirin EC  81 mg Oral QPM   buPROPion  300 mg Oral QHS   enoxaparin (LOVENOX) injection  40 mg Subcutaneous Daily   hydrOXYzine  50 mg Oral QHS   metoprolol succinate  25 mg Oral QPM   nicotine  14 mg Transdermal Daily   NIFEdipine  30 mg Oral QPM   traZODone  50 mg Oral QHS   Continuous Infusions:  lactated ringers 100 mL/hr at 11/23/22 0126   piperacillin-tazobactam (ZOSYN)  IV 3.375 g (11/23/22 1237)   PRN Meds:.acetaminophen **OR** acetaminophen, fentaNYL (SUBLIMAZE) injection, HYDROmorphone (DILAUDID) injection, melatonin, ondansetron (ZOFRAN) IV, oxyCODONE-acetaminophen    Subjective:   Claude Vesco was seen and examined today. Abd pain and nausea.   Objective:   Vitals:   11/23/22 0823 11/23/22 0824 11/23/22 1141 11/23/22 1657  BP: (!) 139/90 (!) 139/90 (!) 122/95 (!) 143/83  Pulse: 94 92 88 84  Resp: 18  18 18   Temp: 98 F (36.7 C) 98 F (36.7 C) 97.9 F (36.6 C)  98.1 F (36.7 C)  TempSrc: Oral Oral Oral Oral  SpO2: 97% 97% 98% 98%  Weight:      Height:        Intake/Output Summary (Last 24 hours) at 11/23/2022 1747 Last data filed at 11/23/2022 0126 Gross per 24 hour  Intake 862.08 ml  Output --  Net 862.08 ml   Filed Weights   11/21/22 1830  Weight: 96.2 kg     Exam General: Alert and oriented x 3, NAD Cardiovascular: S1 S2 auscultated, no murmurs, RRR Respiratory: Clear to auscultation bilaterally, no wheezing, rales or rhonchi Gastrointestinal: Soft, nontender, nondistended, + bowel sounds Ext: no pedal edema bilaterally Neuro: AAOx3, Cr N's II- XII. Strength 5/5 upper and lower extremities bilaterally Skin: No rashes Psych: Normal affect and demeanor, alert and oriented x3    Data Reviewed:  I have personally reviewed following labs and imaging studies   CBC Lab Results  Component Value Date   WBC 7.9 11/23/2022   RBC 4.39 11/23/2022   HGB 12.5 11/23/2022   HCT 39.3 11/23/2022   MCV 89.5  11/23/2022   MCH 28.5 11/23/2022   PLT 228 11/23/2022   MCHC 31.8 11/23/2022   RDW 13.8 11/23/2022   LYMPHSABS 1.1 11/23/2022   MONOABS 0.6 11/23/2022   EOSABS 0.3 11/23/2022   BASOSABS 0.0 11/23/2022     Last metabolic panel Lab Results  Component Value Date   NA 137 11/22/2022   K 3.6 11/22/2022   CL 106 11/22/2022   CO2 23 11/22/2022   BUN 6 11/22/2022   CREATININE 0.62 11/22/2022   GLUCOSE 114 (H) 11/22/2022   GFRNONAA >60 11/22/2022   GFRAA >60 10/03/2019   CALCIUM 8.3 (L) 11/22/2022   PROT 6.3 (L) 11/22/2022   ALBUMIN 3.1 (L) 11/22/2022   BILITOT 0.9 11/22/2022   ALKPHOS 141 (H) 11/22/2022   AST 110 (H) 11/22/2022   ALT 154 (H) 11/22/2022   ANIONGAP 8 11/22/2022    CBG (last 3)  No results for input(s): "GLUCAP" in the last 72 hours.    Coagulation Profile: No results for input(s): "INR", "PROTIME" in the last 168 hours.   Radiology Studies: US Abdomen Limited  Result Date: 11/22/2022 CLINICAL DATA:  Elevated liver enzymes EXAM: ULTRASOUND ABDOMEN LIMITED RIGHT UPPER QUADRANT COMPARISON:  None Available. FINDINGS: Gallbladder: Surgically absent Common bile duct: Diameter: 8 mm Liver: No focal lesion identified. Within normal limits in parenchymal echogenicity. 3.0 x 3.2 x 2.9 cm anechoic lesion in right hepatic lobe, cyst. Portal vein is patent on color Doppler imaging with normal direction of blood flow towards the liver. Other: None. IMPRESSION: Prior cholecystectomy.  No duct dilatation. Electronically Signed   By: Karen Kays M.D.   On: 11/22/2022 17:42       Kathlen Mody M.D. [facility] 11/23/2022, 5:47 PM  Available via Epic secure chat 7am-7pm After 7 pm, please refer to night coverage provider listed on amion.

## 2022-11-23 NOTE — Progress Notes (Signed)
Assessment & Plan: HD#3 - acute diverticulitis with 1.1 cm abscess - IV Zosyn - AF, WBC 7.9 - begin CLD today - encouraged ambulation, OOB  Will follow with you.        [name], MD [facility] practice Office: [phone]        Chief Complaint: Abdominal pain, diverticulitis  Subjective: Patient in bed, pain improving.  Passing flatus, no BM.  Objective: Vital signs in last 24 hours: Temp:  [97.9 F (36.6 C)-98.4 F (36.9 C)] 97.9 F (36.6 C) (09/21 0515) Pulse Rate:  [80-89] 84 (09/21 0515) Resp:  [17-20] 18 (09/21 0515) BP: (135-152)/(86-95) 152/86 (09/21 0515) SpO2:  [98 %-100 %] 99 % (09/21 0515) Last BM Date : 11/21/22  Intake/Output from previous day: 09/20 0701 - 09/21 0700 In: 962.1 [P.O.:100; I.V.:862.1] Out: -  Intake/Output this shift: No intake/output data recorded.  Physical Exam: HEENT - sclerae clear, mucous membranes moist Neck - soft Abdomen - soft, obese; mild to moderate tenderness left mid abd and LLQ, no mass, mild guarding  Lab Results:  Recent Labs    11/21/22 1833 11/23/22 0234  WBC 16.2* 7.9  HGB 13.9 12.5  HCT 43.3 39.3  PLT 272 228   BMET Recent Labs    11/21/22 1833 11/22/22 1056  NA 134* 137  K 3.9 3.6  CL 102 106  CO2 21* 23  GLUCOSE 127* 114*  BUN 7 6  CREATININE 0.71 0.62  CALCIUM 9.1 8.3*   PT/INR No results for input(s): "LABPROT", "INR" in the last 72 hours. Comprehensive Metabolic Panel:    Component Value Date/Time   NA 137 11/22/2022 1056   NA 134 (L) 11/21/2022 1833   K 3.6 11/22/2022 1056   K 3.9 11/21/2022 1833   CL 106 11/22/2022 1056   CL 102 11/21/2022 1833   CO2 23 11/22/2022 1056   CO2 21 (L) 11/21/2022 1833   BUN 6 11/22/2022 1056   BUN 7 11/21/2022 1833   CREATININE 0.62 11/22/2022 1056   CREATININE 0.71 11/21/2022 1833   GLUCOSE 114 (H) 11/22/2022 1056   GLUCOSE 127 (H) 11/21/2022 1833   CALCIUM 8.3 (L) 11/22/2022 1056   CALCIUM 9.1 11/21/2022  1833   AST 110 (H) 11/22/2022 1056   AST 14 (L) 11/21/2022 1833   ALT 154 (H) 11/22/2022 1056   ALT 13 11/21/2022 1833   ALKPHOS 141 (H) 11/22/2022 1056   ALKPHOS 75 11/21/2022 1833   BILITOT 0.9 11/22/2022 1056   BILITOT 0.5 11/21/2022 1833   PROT 6.3 (L) 11/22/2022 1056   PROT 7.2 11/21/2022 1833   ALBUMIN 3.1 (L) 11/22/2022 1056   ALBUMIN 3.7 11/21/2022 1833    Studies/Results: US Abdomen Limited  Result Date: 11/22/2022 CLINICAL DATA:  Elevated liver enzymes EXAM: ULTRASOUND ABDOMEN LIMITED RIGHT UPPER QUADRANT COMPARISON:  None Available. FINDINGS: Gallbladder: Surgically absent Common bile duct: Diameter: 8 mm Liver: No focal lesion identified. Within normal limits in parenchymal echogenicity. 3.0 x 3.2 x 2.9 cm anechoic lesion in right hepatic lobe, cyst. Portal vein is patent on color Doppler imaging with normal direction of blood flow towards the liver. Other: None. IMPRESSION: Prior cholecystectomy.  No duct dilatation. Electronically Signed   By: Karen Kays M.D.   On: 11/22/2022 17:42      [name] 11/23/2022  Patient ID: Sheila Walker, female   DOB: 03/15/1970, 52 y.o.   MRN: [id number]

## 2022-11-24 DIAGNOSIS — K5792 Diverticulitis of intestine, part unspecified, without perforation or abscess without bleeding: Secondary | ICD-10-CM | POA: Diagnosis not present

## 2022-11-24 DIAGNOSIS — I10 Essential (primary) hypertension: Secondary | ICD-10-CM | POA: Diagnosis not present

## 2022-11-24 LAB — CBC WITH DIFFERENTIAL/PLATELET
Abs Immature Granulocytes: 0.03 10*3/uL (ref 0.00–0.07)
Basophils Absolute: 0 10*3/uL (ref 0.0–0.1)
Basophils Relative: 0 %
Eosinophils Absolute: 0.2 10*3/uL (ref 0.0–0.5)
Eosinophils Relative: 4 %
HCT: 37.7 % (ref 36.0–46.0)
Hemoglobin: 12 g/dL (ref 12.0–15.0)
Immature Granulocytes: 1 %
Lymphocytes Relative: 23 %
Lymphs Abs: 1.4 10*3/uL (ref 0.7–4.0)
MCH: 28.7 pg (ref 26.0–34.0)
MCHC: 31.8 g/dL (ref 30.0–36.0)
MCV: 90.2 fL (ref 80.0–100.0)
Monocytes Absolute: 0.5 10*3/uL (ref 0.1–1.0)
Monocytes Relative: 9 %
Neutro Abs: 3.8 10*3/uL (ref 1.7–7.7)
Neutrophils Relative %: 63 %
Platelets: 250 10*3/uL (ref 150–400)
RBC: 4.18 MIL/uL (ref 3.87–5.11)
RDW: 13.9 % (ref 11.5–15.5)
WBC: 6 10*3/uL (ref 4.0–10.5)
nRBC: 0 % (ref 0.0–0.2)

## 2022-11-24 LAB — BASIC METABOLIC PANEL
Anion gap: 14 (ref 5–15)
BUN: 5 mg/dL — ABNORMAL LOW (ref 6–20)
CO2: 23 mmol/L (ref 22–32)
Calcium: 8.9 mg/dL (ref 8.9–10.3)
Chloride: 103 mmol/L (ref 98–111)
Creatinine, Ser: 0.68 mg/dL (ref 0.44–1.00)
GFR, Estimated: >60 mL/min (ref >60)
Glucose, Bld: 65 mg/dL — ABNORMAL LOW (ref 70–99)
Potassium: 3.9 mmol/L (ref 3.5–5.1)
Sodium: 140 mmol/L (ref 135–145)

## 2022-11-24 MED ORDER — ALUM & MAG HYDROXIDE-SIMETH 200-200-20 MG/5ML PO SUSP
30.0000 mL | ORAL | Status: DC | PRN
  Administered 2022-11-24 – 2022-11-25 (×2): 30 mL via ORAL
  Filled 2022-11-24 (×2): qty 30

## 2022-11-24 NOTE — Plan of Care (Signed)

## 2022-11-24 NOTE — Progress Notes (Signed)
Assessment & Plan: HD#4 - acute diverticulitis with 1.1 cm abscess - continue IV Zosyn - AF, WBC 7.9 yesterday - advance to soft diet today - encouraged ambulation, OOB   Will follow with you. Hopefully home tomorrow on oral abx's.        Sheila Level, MD [facility] practice Office: [phone]        Chief Complaint: Diverticulitis with microperf  Subjective: Patient comfortable, denies pain.  Tolerating clear liquid diet.  Objective: Vital signs in last 24 hours: Temp:  [97.9 F (36.6 C)-98.2 F (36.8 C)] 98.1 F (36.7 C) (09/22 0553) Pulse Rate:  [76-94] 76 (09/22 0553) Resp:  [17-20] 20 (09/22 0553) BP: (122-143)/(83-98) 138/98 (09/22 0553) SpO2:  [97 %-100 %] 100 % (09/22 0553) Last BM Date : 11/22/22  Intake/Output from previous day: No intake/output data recorded. Intake/Output this shift: No intake/output data recorded.  Physical Exam: HEENT - sclerae clear, mucous membranes moist Abdomen - soft without distension; mild LLQ tenderness to deep palpation Ext - no edema, non-tender  Lab Results:  Recent Labs    11/21/22 1833 11/23/22 0234  WBC 16.2* 7.9  HGB 13.9 12.5  HCT 43.3 39.3  PLT 272 228   BMET Recent Labs    11/21/22 1833 11/22/22 1056  NA 134* 137  K 3.9 3.6  CL 102 106  CO2 21* 23  GLUCOSE 127* 114*  BUN 7 6  CREATININE 0.71 0.62  CALCIUM 9.1 8.3*   PT/INR No results for input(s): "LABPROT", "INR" in the last 72 hours. Comprehensive Metabolic Panel:    Component Value Date/Time   NA 137 11/22/2022 1056   NA 134 (L) 11/21/2022 1833   K 3.6 11/22/2022 1056   K 3.9 11/21/2022 1833   CL 106 11/22/2022 1056   CL 102 11/21/2022 1833   CO2 23 11/22/2022 1056   CO2 21 (L) 11/21/2022 1833   BUN 6 11/22/2022 1056   BUN 7 11/21/2022 1833   CREATININE 0.62 11/22/2022 1056   CREATININE 0.71 11/21/2022 1833   GLUCOSE 114 (H) 11/22/2022 1056   GLUCOSE 127 (H) 11/21/2022 1833   CALCIUM 8.3 (L)  11/22/2022 1056   CALCIUM 9.1 11/21/2022 1833   AST 187 (H) 11/23/2022 1623   AST 110 (H) 11/22/2022 1056   ALT 382 (H) 11/23/2022 1623   ALT 154 (H) 11/22/2022 1056   ALKPHOS 260 (H) 11/23/2022 1623   ALKPHOS 141 (H) 11/22/2022 1056   BILITOT 0.5 11/23/2022 1623   BILITOT 0.9 11/22/2022 1056   PROT 7.0 11/23/2022 1623   PROT 6.3 (L) 11/22/2022 1056   ALBUMIN 3.2 (L) 11/23/2022 1623   ALBUMIN 3.1 (L) 11/22/2022 1056    Studies/Results: US Abdomen Limited  Result Date: 11/22/2022 CLINICAL DATA:  Elevated liver enzymes EXAM: ULTRASOUND ABDOMEN LIMITED RIGHT UPPER QUADRANT COMPARISON:  None Available. FINDINGS: Gallbladder: Surgically absent Common bile duct: Diameter: 8 mm Liver: No focal lesion identified. Within normal limits in parenchymal echogenicity. 3.0 x 3.2 x 2.9 cm anechoic lesion in right hepatic lobe, cyst. Portal vein is patent on color Doppler imaging with normal direction of blood flow towards the liver. Other: None. IMPRESSION: Prior cholecystectomy.  No duct dilatation. Electronically Signed   By: Karen Kays M.D.   On: 11/22/2022 17:42      Sheila Walker 11/24/2022  Patient ID: Sheila Walker, female   DOB: 1970/06/22, 52 y.o.   MRN: [id number]

## 2022-11-24 NOTE — Plan of Care (Signed)
  Problem: Education: Goal: Knowledge of General Education information will improve Description Including pain rating scale, medication(s)/side effects and non-pharmacologic comfort measures Outcome: Progressing   Problem: Nutrition: Goal: Adequate nutrition will be maintained Outcome: Progressing   Problem: Pain Managment: Goal: General experience of comfort will improve Outcome: Progressing

## 2022-11-24 NOTE — Progress Notes (Signed)
[facility]                                                                               Sheila Walker, is a 52 y.o. female, DOB - May 24, 1970, ZOX:[id number] Admit date - 11/21/2022    Outpatient Primary MD for the patient is Dolleschel, Irving Burton, FNP  LOS - 2  days    Brief summary   52 year old female with past medical history of HLD, HTN, OSA, tobacco use. For the past 2 weeks she has noticed abd pain with some nausea . No vomiting. She underwent CT abd and pelvis showing diverticulitis with possible small abscess formation. She was directed to the ER.  Gen surgery consulted, recommended to watch with IV antibiotics, IV fluids.   Assessment & Plan    Assessment and Plan:  Acute sigmoid diverticulitis, concern for small intramural abscess  Conservative management with IV fluids, iv antibiotics, anti emetics.  Gen surgery consulted. Started on clears and advanced to soft today, as per the general surgeon.  - wbc count wnl. Remains afebrile,  - recommend getting out of bed and ambulate.     Elevated liver enzymes:  Right upper quadrant US unremarkable.  Statin on hold.  Check hepatitis panel .   Hypertension:  Well controlled.       Estimated body mass index is 33.22 kg/m as calculated from the following:   Height as of this encounter: 5\' 7"  (1.702 m).   Weight as of this encounter: 96.2 kg.  Code Status: full code DVT Prophylaxis:  enoxaparin (LOVENOX) injection 40 mg Start: 11/22/22 1000   Level of Care: Level of care: Med-Surg Family Communication: none at bedside  Disposition Plan:     Remains inpatient appropriate:  IV antibiotics.   Procedures:  None.   Consultants:   General surgery.   Antimicrobials:   Anti-infectives (From admission, onward)    Start     Dose/Rate Route Frequency Ordered Stop   11/21/22 2330  piperacillin-tazobactam (ZOSYN) IVPB 3.375 g        3.375 g 12.5 mL/hr over 240 Minutes Intravenous Every 8 hours  11/21/22 2327          Medications  Scheduled Meds:  aspirin EC  81 mg Oral QPM   buPROPion  300 mg Oral QHS   enoxaparin (LOVENOX) injection  40 mg Subcutaneous Daily   hydrOXYzine  50 mg Oral QHS   metoprolol succinate  25 mg Oral QPM   nicotine  14 mg Transdermal Daily   NIFEdipine  30 mg Oral QPM   traZODone  50 mg Oral QHS   Continuous Infusions:  lactated ringers 100 mL/hr at 11/24/22 0907   piperacillin-tazobactam (ZOSYN)  IV 3.375 g (11/24/22 1247)   PRN Meds:.acetaminophen **OR** acetaminophen, fentaNYL (SUBLIMAZE) injection, HYDROmorphone (DILAUDID) injection, melatonin, ondansetron (ZOFRAN) IV, oxyCODONE-acetaminophen    Subjective:   Willesha Walker was seen and examined today. Abd pain is improving, no nausea or vomiting.   Objective:   Vitals:   11/23/22 2117 11/24/22 0553 11/24/22 0823 11/24/22 1852  BP: (!) 141/89 (!) 138/98 (!) 138/95 (!) 122/91  Pulse: 81 76 83 84  Resp: 17 20 16 18   Temp:  98.2 F (36.8 C) 98.1 F (36.7 C) 97.7 F (36.5 C) 98 F (36.7 C)  TempSrc: Oral Oral Oral Oral  SpO2: 99% 100% 99% 99%  Weight:      Height:       No intake or output data in the 24 hours ending 11/24/22 1858  Filed Weights   11/21/22 1830  Weight: 96.2 kg     Exam General exam: Appears calm and comfortable  Respiratory system: Clear to auscultation. Respiratory effort normal. Cardiovascular system: S1 & S2 heard, RRR.  Gastrointestinal system: Abdomen is soft mild tenderness in the LLQ. Central nervous system: Alert and oriented. No focal neurological deficits. Extremities: Symmetric 5 x 5 power. Skin: No rashes, lesions or ulcers    Data Reviewed:  I have personally reviewed following labs and imaging studies   CBC Lab Results  Component Value Date   WBC 6.0 11/24/2022   RBC 4.18 11/24/2022   HGB 12.0 11/24/2022   HCT 37.7 11/24/2022   MCV 90.2 11/24/2022   MCH 28.7 11/24/2022   PLT 250 11/24/2022   MCHC 31.8 11/24/2022   RDW 13.9  11/24/2022   LYMPHSABS 1.4 11/24/2022   MONOABS 0.5 11/24/2022   EOSABS 0.2 11/24/2022   BASOSABS 0.0 11/24/2022     Last metabolic panel Lab Results  Component Value Date   NA 140 11/24/2022   K 3.9 11/24/2022   CL 103 11/24/2022   CO2 23 11/24/2022   BUN 5 (L) 11/24/2022   CREATININE 0.68 11/24/2022   GLUCOSE 65 (L) 11/24/2022   GFRNONAA >60 11/24/2022   GFRAA >60 10/03/2019   CALCIUM 8.9 11/24/2022   PROT 7.0 11/23/2022   ALBUMIN 3.2 (L) 11/23/2022   BILITOT 0.5 11/23/2022   ALKPHOS 260 (H) 11/23/2022   AST 187 (H) 11/23/2022   ALT 382 (H) 11/23/2022   ANIONGAP 14 11/24/2022    CBG (last 3)  No results for input(s): "GLUCAP" in the last 72 hours.    Coagulation Profile: No results for input(s): "INR", "PROTIME" in the last 168 hours.   Radiology Studies: No results found.     Kathlen Mody M.D. [facility] 11/24/2022, 6:58 PM  Available via Epic secure chat 7am-7pm After 7 pm, please refer to night coverage provider listed on amion.

## 2022-11-25 DIAGNOSIS — K5792 Diverticulitis of intestine, part unspecified, without perforation or abscess without bleeding: Secondary | ICD-10-CM | POA: Diagnosis not present

## 2022-11-25 DIAGNOSIS — I10 Essential (primary) hypertension: Secondary | ICD-10-CM | POA: Diagnosis not present

## 2022-11-25 LAB — CBC
HCT: 40.9 % (ref 36.0–46.0)
Hemoglobin: 13 g/dL (ref 12.0–15.0)
MCH: 28.3 pg (ref 26.0–34.0)
MCHC: 31.8 g/dL (ref 30.0–36.0)
MCV: 89.1 fL (ref 80.0–100.0)
Platelets: 285 10*3/uL (ref 150–400)
RBC: 4.59 MIL/uL (ref 3.87–5.11)
RDW: 13.4 % (ref 11.5–15.5)
WBC: 8 10*3/uL (ref 4.0–10.5)
nRBC: 0 % (ref 0.0–0.2)

## 2022-11-25 LAB — HEPATIC FUNCTION PANEL
ALT: 354 U/L — ABNORMAL HIGH (ref 0–44)
AST: 86 U/L — ABNORMAL HIGH (ref 15–41)
Albumin: 3.3 g/dL — ABNORMAL LOW (ref 3.5–5.0)
Alkaline Phosphatase: 238 U/L — ABNORMAL HIGH (ref 38–126)
Bilirubin, Direct: <0.1 mg/dL (ref 0.0–0.2)
Total Bilirubin: 0.3 mg/dL (ref 0.3–1.2)
Total Protein: 7 g/dL (ref 6.5–8.1)

## 2022-11-25 LAB — HEPATITIS PANEL, ACUTE
HCV Ab: NONREACTIVE
Hep A IgM: NONREACTIVE
Hep B C IgM: NONREACTIVE
Hepatitis B Surface Ag: NONREACTIVE

## 2022-11-25 MED ORDER — HYDRALAZINE HCL 25 MG PO TABS: 25.0000 mg | ORAL_TABLET | Freq: Three times a day (TID) | ORAL | Status: DC | PRN

## 2022-11-25 NOTE — Progress Notes (Addendum)
Subjective: CC: Patient reports after being advanced to soft diet she had worsening LLQ crampy pain yesterday. Also having some indigestion but no n/v. Passing flatus. No BM. Used Percocet x 3 yesterday.    Reports this is the first episode of diverticulitis. Her last colonoscopy was in 2019. I am unable to see the full results of this in care everywhere.    Objective: Vital signs in last 24 hours: Temp:  [97.7 F (36.5 C)-98.2 F (36.8 C)] 98.2 F (36.8 C) (09/23 0717) Pulse Rate:  [78-87] 87 (09/23 0717) Resp:  [16-20] 16 (09/23 0717) BP: (122-148)/(91-100) 148/100 (09/23 0717) SpO2:  [98 %-100 %] 100 % (09/23 0717) Last BM Date : 11/22/22  Intake/Output from previous day: No intake/output data recorded. Intake/Output this shift: No intake/output data recorded.  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 CMP     Component Value Date/Time   NA 140 11/24/2022 0236   K 3.9 11/24/2022 0236   CL 103 11/24/2022 0236   CO2 23 11/24/2022 0236   GLUCOSE 65 (L) 11/24/2022 0236   BUN 5 (L) 11/24/2022 0236   CREATININE 0.68 11/24/2022 0236   CALCIUM 8.9 11/24/2022 0236   PROT 7.0 11/25/2022 0224   ALBUMIN 3.3 (L) 11/25/2022 0224   AST 86 (H) 11/25/2022 0224   ALT 354 (H) 11/25/2022 0224   ALKPHOS 238 (H) 11/25/2022 0224   BILITOT 0.3 11/25/2022 0224   GFRNONAA >60 11/24/2022 0236   GFRAA >60 10/03/2019 1225   Lipase     Component  Value Date/Time   LIPASE 23 11/21/2022 1833    Studies/Results: No results found.  Anti-infectives: Anti-infectives (From admission, onward)    Start     Dose/Rate Route Frequency Ordered Stop   11/21/22 2330  piperacillin-tazobactam (ZOSYN) IVPB 3.375 g        3.375 g 12.5 mL/hr over 240 Minutes Intravenous Every 8 hours 11/21/22 2327          Assessment/Plan Sigmoid diverticulitis with abscess - CT w/ 9/19 done at outside hospital with Acute sigmoid colitis/diverticulitis with ill-defined, low-attenuation focus within the inflamed segment, concerning for small intramural abscess. No evidence of perforation or obstruction.  - Would not recommend IR drainage as this was intramural - No current indication for emergency surgery - With worsening pain after being advanced to soft diet yesterday, will back down to FLD and repeat CBC (WBC 6 on 9/22). Consider repeat CT in the next 24 hours if not improving and develops leukocytosis.  - Hopefully patient will improve with conservative treatment.  If patient fails to improve they may require repeating imaging, drain placement, or surgical intervention resulting in a colectomy/colostomy.  This was discussed with the patient. - If patient improves with conservative therapies would recommend colonoscopy  in ~4-6 weeks.   - We will follow with you  FEN - FLD, IVF per [facility] VTE - SCDs, ASA, Lovenox ID - Zosyn  I reviewed nursing notes, last 24 h vitals and pain scores, last 48 h intake and output, last 24 h labs and trends, and last 24 h imaging results.   LOS: 3 days    Jacinto Halim , [facility] 11/25/2022, 8:13 AM Please see Amion for pager number during day hours 7:00am-4:30pm

## 2022-11-25 NOTE — Progress Notes (Signed)
[facility]                                                                               Sheila Walker, is a 52 y.o. female, DOB - [birth year], WUJ:[id number] Admit date - 11/21/2022    Outpatient Primary MD for the patient is Dolleschel, Sheila Burton, FNP  LOS - 3  days    Brief summary   52 year old female with past medical history of HLD, HTN, OSA, tobacco use. For the past 2 weeks she has noticed abd pain with some nausea . No vomiting. She underwent CT abd and pelvis showing diverticulitis with possible small abscess formation. She was directed to the ER.  Gen surgery consulted, recommended to watch with IV antibiotics, IV fluids.   Assessment & Plan    Assessment and Plan:  Acute sigmoid diverticulitis, concern for small intramural abscess  Conservative management with IV fluids, iv antibiotics, anti emetics.  Gen surgery consulted. Started on clears , advanced to soft diet yesterday, but she reports having worsening pain after eating.  - she is back on liquid diet , general surgery watching her recommending to get a repeat CT abd and pelvis if pain doesn't improve or if she develops leukocytosis.  - wbc count wnl. Remains afebrile,  - recommend getting out of bed and ambulate.     Elevated liver enzymes:  Right upper quadrant US unremarkable.  Statin on hold.  Acute hepatitis panel is negative.  Repeat panel shows improving liver panel.    Hypertension:  Suboptimal. Currently on metoprolol and nifedipine.  Currently on prn hydralazine.       Estimated body mass index is 33.22 kg/m as calculated from the following:   Height as of this encounter: 5\' 7"  (1.702 m).   Weight as of this encounter: 96.2 kg.  Code Status: full code DVT Prophylaxis:  enoxaparin (LOVENOX) injection 40 mg Start: 11/22/22 1000   Level of Care: Level of care: Med-Surg Family Communication: none at bedside  Disposition Plan:     Remains inpatient appropriate:  IV  antibiotics.   Procedures:  None.   Consultants:   General surgery.   Antimicrobials:   Anti-infectives (From admission, onward)    Start     Dose/Rate Route Frequency Ordered Stop   11/21/22 2330  piperacillin-tazobactam (ZOSYN) IVPB 3.375 g        3.375 g 12.5 mL/hr over 240 Minutes Intravenous Every 8 hours 11/21/22 2327          Medications  Scheduled Meds:  aspirin EC  81 mg Oral QPM   buPROPion  300 mg Oral QHS   enoxaparin (LOVENOX) injection  40 mg Subcutaneous Daily   hydrOXYzine  50 mg Oral QHS   metoprolol succinate  25 mg Oral QPM   nicotine  14 mg Transdermal Daily   NIFEdipine  30 mg Oral QPM   traZODone  50 mg Oral QHS   Continuous Infusions:  lactated ringers 100 mL/hr at 11/25/22 0613   piperacillin-tazobactam (ZOSYN)  IV 3.375 g (11/25/22 0547)   PRN Meds:.acetaminophen **OR** acetaminophen, alum & mag hydroxide-simeth, fentaNYL (SUBLIMAZE) injection, HYDROmorphone (DILAUDID) injection, melatonin, ondansetron (ZOFRAN) IV, oxyCODONE-acetaminophen    Subjective:  Sheila Walker was seen and examined today. Abd pain is persistent, no nausea or vomiting.   Objective:   Vitals:   11/24/22 0823 11/24/22 1852 11/24/22 2055 11/25/22 0717  BP: (!) 138/95 (!) 122/91 (!) 142/92 (!) 148/100  Pulse: 83 84 78 87  Resp: 16 18 20 16   Temp: 97.7 F (36.5 C) 98 F (36.7 C) 98 F (36.7 C) 98.2 F (36.8 C)  TempSrc: Oral Oral Oral Oral  SpO2: 99% 99% 98% 100%  Weight:      Height:        Intake/Output Summary (Last 24 hours) at 11/25/2022 1155 Last data filed at 11/25/2022 1124 Gross per 24 hour  Intake --  Output 1 ml  Net -1 ml    Filed Weights   11/21/22 1830  Weight: 96.2 kg     Exam General exam: Appears calm and comfortable  Respiratory system: Clear to auscultation. Respiratory effort normal. Cardiovascular system: S1 & S2 heard, RRR. No JVD,  Gastrointestinal system: Abdomen is soft , tender bs+ Central nervous system: Alert and  oriented. No focal neurological deficits. Extremities: Symmetric 5 x 5 power. Skin: No rashes, lesions or ulcers Psychiatry:  Mood & affect appropriate.    Data Reviewed:  I have personally reviewed following labs and imaging studies   CBC Lab Results  Component Value Date   WBC 8.0 11/25/2022   RBC 4.59 11/25/2022   HGB 13.0 11/25/2022   HCT 40.9 11/25/2022   MCV 89.1 11/25/2022   MCH 28.3 11/25/2022   PLT 285 11/25/2022   MCHC 31.8 11/25/2022   RDW 13.4 11/25/2022   LYMPHSABS 1.4 11/24/2022   MONOABS 0.5 11/24/2022   EOSABS 0.2 11/24/2022   BASOSABS 0.0 11/24/2022     Last metabolic panel Lab Results  Component Value Date   NA 140 11/24/2022   K 3.9 11/24/2022   CL 103 11/24/2022   CO2 23 11/24/2022   BUN 5 (L) 11/24/2022   CREATININE 0.68 11/24/2022   GLUCOSE 65 (L) 11/24/2022   GFRNONAA >60 11/24/2022   GFRAA >60 10/03/2019   CALCIUM 8.9 11/24/2022   PROT 7.0 11/25/2022   ALBUMIN 3.3 (L) 11/25/2022   BILITOT 0.3 11/25/2022   ALKPHOS 238 (H) 11/25/2022   AST 86 (H) 11/25/2022   ALT 354 (H) 11/25/2022   ANIONGAP 14 11/24/2022    CBG (last 3)  No results for input(s): "GLUCAP" in the last 72 hours.    Coagulation Profile: No results for input(s): "INR", "PROTIME" in the last 168 hours.   Radiology Studies: No results found.     Kathlen Mody M.D. [facility] 11/25/2022, 11:55 AM  Available via Epic secure chat 7am-7pm After 7 pm, please refer to night coverage provider listed on amion.

## 2022-11-25 NOTE — Plan of Care (Signed)

## 2022-11-25 NOTE — Plan of Care (Signed)

## 2022-11-26 ENCOUNTER — Inpatient Hospital Stay (HOSPITAL_COMMUNITY)

## 2022-11-26 DIAGNOSIS — I10 Essential (primary) hypertension: Secondary | ICD-10-CM | POA: Diagnosis not present

## 2022-11-26 DIAGNOSIS — K5792 Diverticulitis of intestine, part unspecified, without perforation or abscess without bleeding: Secondary | ICD-10-CM | POA: Diagnosis not present

## 2022-11-26 MED ORDER — SENNOSIDES-DOCUSATE SODIUM 8.6-50 MG PO TABS
2.0000 | ORAL_TABLET | Freq: Two times a day (BID) | ORAL | Status: DC
  Administered 2022-11-26 – 2022-11-27 (×3): 2 via ORAL
  Filled 2022-11-26 (×3): qty 2

## 2022-11-26 MED ORDER — POLYETHYLENE GLYCOL 3350 17 G PO PACK
17.0000 g | PACK | Freq: Every day | ORAL | Status: DC
  Administered 2022-11-26: 17 g via ORAL
  Filled 2022-11-26: qty 1

## 2022-11-26 MED ORDER — NIFEDIPINE ER OSMOTIC RELEASE 60 MG PO TB24
60.0000 mg | ORAL_TABLET | Freq: Every evening | ORAL | Status: DC
  Administered 2022-11-26 – 2022-11-27 (×2): 60 mg via ORAL
  Filled 2022-11-26 (×2): qty 1

## 2022-11-26 MED ORDER — IOHEXOL 350 MG/ML SOLN
75.0000 mL | Freq: Once | INTRAVENOUS | Status: AC | PRN
  Administered 2022-11-26: 75 mL via INTRAVENOUS

## 2022-11-26 MED ORDER — POLYETHYLENE GLYCOL 3350 17 G PO PACK
17.0000 g | PACK | Freq: Two times a day (BID) | ORAL | Status: DC
  Administered 2022-11-26 – 2022-11-27 (×2): 17 g via ORAL
  Filled 2022-11-26 (×2): qty 1

## 2022-11-26 NOTE — Progress Notes (Signed)
Subjective: CC: Her crampy llq abdominal pain improved after bm yesterday however she now has new left mid abdominal pain today. Tolerating fld without worsening abdominal pain or n/v. No PRN pain medication over the last 24 hours. Afebrile. No tachycardia or hypotension. WBC wnl yesterday.   Objective: Vital signs in last 24 hours: Temp:  [97.9 F (36.6 C)-98.4 F (36.9 C)] 98.2 F (36.8 C) (09/24 0812) Pulse Rate:  [72-88] 72 (09/24 0812) Resp:  [16-17] 17 (09/24 0812) BP: (140-152)/(93-99) 144/93 (09/24 0812) SpO2:  [97 %-100 %] 100 % (09/24 0812) Last BM Date : 11/22/22  Intake/Output from previous day: 09/23 0701 - 09/24 0700 In: 3497.9 [P.O.:360; I.V.:3137.9] Out: 1 [Stool:1] Intake/Output this shift: No intake/output data recorded.  PE: Gen:  Alert, NAD, pleasant Abd: Soft, ND, left sided ttp on exam today that is new from yesterday. No rigidity or guarding and otherwise NT. +BS  Lab Results:  Recent Labs    11/24/22 0236 11/25/22 0846  WBC 6.0 8.0  HGB 12.0 13.0  HCT 37.7 40.9  PLT 250 285   BMET Recent Labs    11/24/22 0236  NA 140  K 3.9  CL 103  CO2 23  GLUCOSE 65*  BUN 5*  CREATININE 0.68  CALCIUM 8.9   PT/INR No results for input(s): "LABPROT", "INR" in the last 72 hours. CMP     Component Value Date/Time   NA 140 11/24/2022 0236   K 3.9 11/24/2022 0236   CL 103 11/24/2022 0236   CO2 23 11/24/2022 0236   GLUCOSE 65 (L) 11/24/2022 0236   BUN 5 (L) 11/24/2022 0236   CREATININE 0.68 11/24/2022 0236   CALCIUM 8.9 11/24/2022 0236   PROT 7.0 11/25/2022 0224   ALBUMIN 3.3 (L) 11/25/2022 0224   AST 86 (H) 11/25/2022 0224   ALT 354 (H) 11/25/2022 0224   ALKPHOS 238 (H) 11/25/2022 0224   BILITOT 0.3 11/25/2022 0224   GFRNONAA >60 11/24/2022 0236   GFRAA >60 10/03/2019 1225   Lipase     Component Value Date/Time   LIPASE 23 11/21/2022 1833    Studies/Results: No results found.  Anti-infectives: Anti-infectives (From  admission, onward)    Start     Dose/Rate Route Frequency Ordered Stop   11/21/22 2330  piperacillin-tazobactam (ZOSYN) IVPB 3.375 g        3.375 g 12.5 mL/hr over 240 Minutes Intravenous Every 8 hours 11/21/22 2327          Assessment/Plan Sigmoid diverticulitis with abscess - CT w/ 9/19 done at outside hospital with Acute sigmoid colitis/diverticulitis with ill-defined, low-attenuation focus within the inflamed segment, concerning for small intramural abscess. No evidence of perforation or obstruction.  - Would not recommend IR drainage as this was intramural - Afebrile. No tachycardia or hypotension. WBC wnl. No peritonitis on exam. No current indication for emergency surgery - Will obtain CT A/P today given new L sided abdominal ttp from yesterday. Keep NPO incase there is something drainable by IR.  - Hopefully patient will improve with conservative treatment.  If patient fails to improve they may require repeating imaging (pending today), drain placement, or surgical intervention resulting in a colectomy/colostomy.  This was discussed with the patient. - If patient improves with conservative therapies would recommend colonoscopy in ~4-6 weeks.   - We will follow with you  FEN - NPO for CT, IVF per [facility] VTE - SCDs, ASA, Lovenox ID - Zosyn  I reviewed nursing notes, last 58  h vitals and pain scores, last 48 h intake and output, last 24 h labs and trends, and last 24 h imaging results.   LOS: 4 days    Jacinto Halim , [facility] 11/26/2022, 8:56 AM Please see Amion for pager number during day hours 7:00am-4:30pm

## 2022-11-26 NOTE — Progress Notes (Signed)
[facility]                                                                               Sheila Walker, is a 52 y.o. female, DOB - Nov 08, 1970, ZOX:[id number] Admit date - 11/21/2022    Outpatient Primary MD for the patient is Dolleschel, Irving Burton, FNP  LOS - 4  days    Brief summary   52 year old female with past medical history of HLD, HTN, OSA, tobacco use. For the past 2 weeks she has noticed abd pain with some nausea . No vomiting. She underwent CT abd and pelvis showing diverticulitis with possible small abscess formation. She was directed to the ER.  Gen surgery consulted, recommended to watch with IV antibiotics, IV fluids.   Assessment & Plan    Assessment and Plan:  Acute sigmoid diverticulitis, concern for small intramural abscess  Conservative management with IV fluids, iv antibiotics, anti emetics.  Gen surgery consulted. Started on clears , advanced to soft diet yesterday, but she reports having worsening pain after eating. A repeat CT abd and pelvis with contrast done, did not show worsening signs. Shew as restarted back on the soft diet.  - wbc count wnl. Remains afebrile,  - recommend getting out of bed and ambulate.  - if continues to improve, possible d/c in am.     Elevated liver enzymes:  Right upper quadrant US unremarkable.  Statin on hold.  Acute hepatitis panel is negative.  Repeat panel shows improving liver enzymes.    Hypertension:  Suboptimal. Currently on metoprolol and nifedipine. Increased the dose of nifedipine to 60 mg daily.  Added hydralazine prn.    Tobacco abuse:  Counseled on cessation.    Estimated body mass index is 33.22 kg/m as calculated from the following:   Height as of this encounter: 5\' 7"  (1.702 m).   Weight as of this encounter: 96.2 kg.  Code Status: full code DVT Prophylaxis:  enoxaparin (LOVENOX) injection 40 mg Start: 11/22/22 1000   Level of Care: Level of care: Med-Surg Family Communication:  none at bedside  Disposition Plan:     Remains inpatient appropriate:  IV antibiotics.   Procedures:  CT abd and pelvis.   Consultants:   General surgery.   Antimicrobials:   Anti-infectives (From admission, onward)    Start     Dose/Rate Route Frequency Ordered Stop   11/21/22 2330  piperacillin-tazobactam (ZOSYN) IVPB 3.375 g        3.375 g 12.5 mL/hr over 240 Minutes Intravenous Every 8 hours 11/21/22 2327          Medications  Scheduled Meds:  aspirin EC  81 mg Oral QPM   buPROPion  300 mg Oral QHS   enoxaparin (LOVENOX) injection  40 mg Subcutaneous Daily   hydrOXYzine  50 mg Oral QHS   metoprolol succinate  25 mg Oral QPM   nicotine  14 mg Transdermal Daily   NIFEdipine  30 mg Oral QPM   polyethylene glycol  17 g Oral Daily   senna-docusate  2 tablet Oral BID   traZODone  50 mg Oral QHS   Continuous Infusions:  lactated ringers 100 mL/hr at 11/25/22 2217  piperacillin-tazobactam (ZOSYN)  IV 3.375 g (11/26/22 1323)   PRN Meds:.acetaminophen **OR** acetaminophen, alum & mag hydroxide-simeth, fentaNYL (SUBLIMAZE) injection, hydrALAZINE, HYDROmorphone (DILAUDID) injection, melatonin, ondansetron (ZOFRAN) IV, oxyCODONE-acetaminophen    Subjective:   Tranice Stiff was seen and examined today. Abd pain has improved , no nausea or vomiting.   Objective:   Vitals:   11/25/22 0717 11/25/22 1609 11/25/22 2202 11/26/22 0812  BP: (!) 148/100 (!) 140/96 (!) 152/99 (!) 144/93  Pulse: 87 88 80 72  Resp: 16 16 17 17   Temp: 98.2 F (36.8 C) 97.9 F (36.6 C) 98.4 F (36.9 C) 98.2 F (36.8 C)  TempSrc: Oral Oral Oral   SpO2: 100% 97% 98% 100%  Weight:      Height:        Intake/Output Summary (Last 24 hours) at 11/26/2022 1439 Last data filed at 11/26/2022 1100 Gross per 24 hour  Intake 3737.92 ml  Output 1 ml  Net 3736.92 ml    Filed Weights   11/21/22 1830  Weight: 96.2 kg     Exam General exam: Appears calm and comfortable  Respiratory system:  Clear to auscultation. Respiratory effort normal. Cardiovascular system: S1 & S2 heard, RRR.  Gastrointestinal system: Abdomen is soft, mild tenderness int he LLQ. BS+ Central nervous system: Alert and oriented. No focal neurological deficits. Extremities: Symmetric 5 x 5 power. Skin: No rashes,  Psychiatry: Mood & affect appropriate.     Data Reviewed:  I have personally reviewed following labs and imaging studies   CBC Lab Results  Component Value Date   WBC 8.0 11/25/2022   RBC 4.59 11/25/2022   HGB 13.0 11/25/2022   HCT 40.9 11/25/2022   MCV 89.1 11/25/2022   MCH 28.3 11/25/2022   PLT 285 11/25/2022   MCHC 31.8 11/25/2022   RDW 13.4 11/25/2022   LYMPHSABS 1.4 11/24/2022   MONOABS 0.5 11/24/2022   EOSABS 0.2 11/24/2022   BASOSABS 0.0 11/24/2022     Last metabolic panel Lab Results  Component Value Date   NA 140 11/24/2022   K 3.9 11/24/2022   CL 103 11/24/2022   CO2 23 11/24/2022   BUN 5 (L) 11/24/2022   CREATININE 0.68 11/24/2022   GLUCOSE 65 (L) 11/24/2022   GFRNONAA >60 11/24/2022   GFRAA >60 10/03/2019   CALCIUM 8.9 11/24/2022   PROT 7.0 11/25/2022   ALBUMIN 3.3 (L) 11/25/2022   BILITOT 0.3 11/25/2022   ALKPHOS 238 (H) 11/25/2022   AST 86 (H) 11/25/2022   ALT 354 (H) 11/25/2022   ANIONGAP 14 11/24/2022    CBG (last 3)  No results for input(s): "GLUCAP" in the last 72 hours.    Coagulation Profile: No results for input(s): "INR", "PROTIME" in the last 168 hours.   Radiology Studies: CT ABDOMEN PELVIS W CONTRAST  Result Date: 11/26/2022 CLINICAL DATA:  52 year old female with a history of suspected diverticulitis EXAM: CT ABDOMEN AND PELVIS WITH CONTRAST TECHNIQUE: Multidetector CT imaging of the abdomen and pelvis was performed using the standard protocol following bolus administration of intravenous contrast. RADIATION DOSE REDUCTION: This exam was performed according to the departmental dose-optimization program which includes automated  exposure control, adjustment of the mA and/or kV according to patient size and/or use of iterative reconstruction technique. CONTRAST:  75mL OMNIPAQUE IOHEXOL 350 MG/ML SOLN COMPARISON:  08/04/2017 Abdominal MRI 03/27/2019 FINDINGS: Lower chest: No acute finding of the lower chest Hepatobiliary: Low-density/nonenhancing cystic structure of the right posterior liver measures 3.4 cm, most compatible with benign biliary cyst. Trace  intrahepatic ductal dilatation with no radiopaque stones. Surgical changes of cholecystectomy, likely accounts for the mild ductal dilatation. No inflammatory changes. Unremarkable appearance of the biliary ducts at the head of the pancreas. Pancreas: Unremarkable Spleen: Unremarkable Adrenals/Urinary Tract: Unremarkable adrenal glands. Right kidney unremarkable with no hydronephrosis or nephrolithiasis. No inflammatory changes. Left kidney without hydronephrosis or nephrolithiasis. 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 Unremarkable urinary bladder Stomach/Bowel: Unremarkable stomach.  Unremarkable small bowel. Normal appendix. Moderate to large formed stool burden. Colonic diverticula of the transverse colon through the sigmoid colon. Wall thickening of the sigmoid colon with inflammatory changes of the adjacent fat. No focal fluid or extraluminal air. Mucosal enhancement within normal limits. Vascular/Lymphatic: Minimal atherosclerosis of the abdominal aorta. No aneurysm. Mesenteric arteries and the renal arteries patent. Iliac arteries and the proximal femoral arteries patent. Unremarkable venous structures. Unremarkable portal venous structures. No adenopathy. Reproductive: Unremarkable uterus and adnexa. Other: Small fat containing umbilical hernia. Musculoskeletal: Degenerative changes of L4-L5 with vacuum disc phenomenon. Posterior disc protrusion with partial calcification  without significant canal narrowing. No displaced fracture. No significant hip degenerative changes. IMPRESSION: Acute diverticulitis of the sigmoid colon, without complicating features. Moderate to large formed stool burden. Additional ancillary findings as above. Electronically Signed   By: Gilmer Mor D.O.   On: 11/26/2022 12:13       Kathlen Mody M.D. [facility] 11/26/2022, 2:39 PM  Available via Epic secure chat 7am-7pm After 7 pm, please refer to night coverage provider listed on amion.

## 2022-11-26 NOTE — Care Management Important Message (Signed)
Important Message  Patient Details  Name: Sheila Walker MRN: [id number] Date of Birth: Nov 20, 1970   Important Message Given:  Yes - [employer] IM     Sherilyn Banker 11/26/2022, 4:03 PM

## 2022-11-27 DIAGNOSIS — K5792 Diverticulitis of intestine, part unspecified, without perforation or abscess without bleeding: Secondary | ICD-10-CM | POA: Diagnosis not present

## 2022-11-27 LAB — COMPREHENSIVE METABOLIC PANEL
ALT: 145 U/L — ABNORMAL HIGH (ref 0–44)
AST: 30 U/L (ref 15–41)
Albumin: 3 g/dL — ABNORMAL LOW (ref 3.5–5.0)
Alkaline Phosphatase: 146 U/L — ABNORMAL HIGH (ref 38–126)
Anion gap: 9 (ref 5–15)
BUN: 11 mg/dL (ref 6–20)
CO2: 27 mmol/L (ref 22–32)
Calcium: 9.1 mg/dL (ref 8.9–10.3)
Chloride: 101 mmol/L (ref 98–111)
Creatinine, Ser: 0.77 mg/dL (ref 0.44–1.00)
GFR, Estimated: >60 mL/min (ref >60)
Glucose, Bld: 159 mg/dL — ABNORMAL HIGH (ref 70–99)
Potassium: 3.8 mmol/L (ref 3.5–5.1)
Sodium: 137 mmol/L (ref 135–145)
Total Bilirubin: 0.3 mg/dL (ref 0.3–1.2)
Total Protein: 6.2 g/dL — ABNORMAL LOW (ref 6.5–8.1)

## 2022-11-27 MED ORDER — OXYCODONE-ACETAMINOPHEN 5-325 MG PO TABS: 1.0000 | ORAL_TABLET | Freq: Four times a day (QID) | ORAL | 0 refills | Status: AC | PRN

## 2022-11-27 MED ORDER — POLYETHYLENE GLYCOL 3350 17 G PO PACK: 17.0000 g | PACK | Freq: Two times a day (BID) | ORAL | 0 refills | Status: AC

## 2022-11-27 MED ORDER — SENNOSIDES-DOCUSATE SODIUM 8.6-50 MG PO TABS: 2.0000 | ORAL_TABLET | Freq: Two times a day (BID) | ORAL | 0 refills | Status: AC

## 2022-11-27 MED ORDER — AMOXICILLIN-POT CLAVULANATE 875-125 MG PO TABS: 1.0000 | ORAL_TABLET | Freq: Two times a day (BID) | ORAL | 0 refills | Status: AC

## 2022-11-27 MED ORDER — AMOXICILLIN-POT CLAVULANATE 875-125 MG PO TABS
1.0000 | ORAL_TABLET | Freq: Two times a day (BID) | ORAL | Status: DC
  Administered 2022-11-27: 1 via ORAL
  Filled 2022-11-27: qty 1

## 2022-11-27 NOTE — Progress Notes (Signed)
[facility]                                                                               Sheila Walker, is a 52 y.o. female, DOB - [birth year], ZOX:[id number] Admit date - 11/21/2022    Outpatient Primary MD for the patient is Dolleschel, Sheila Burton, FNP  LOS - 5  days    Brief summary   52 year old female with past medical history of HLD, HTN, OSA, tobacco use. For the past 2 weeks she has noticed abd pain with some nausea . No vomiting. She underwent CT abd and pelvis showing diverticulitis with possible small abscess formation. She was directed to the ER.  Gen surgery consulted, recommended to watch with IV antibiotics, IV fluids.   Assessment & Plan    Acute sigmoid diverticulitis Patient was hospitalized.  Started on conservative management.  Given IV fluids and IV antibiotics.  Seen by general surgery.  CT scan repeated yesterday and does not show any worsening signs.  No abscess.  Will transition him to oral antibiotics today.  Continue with pain medications.  Possible discharge later today or tomorrow depending on her pain medication requirements. CT scan did suggest significant constipation.  Patient was started on laxatives yesterday.  And she has had a bowel movement.  This has been discussed in detail with the patient.  She plans to increase fiber in her diet. Last colonoscopy was about 5 years ago.  Sigmoid polyp was noted.  Diverticulosis and internal hemorrhoids were noted.  General surgery recommends colonoscopy.  Will asked the patient to call her gastroenterologist for follow-up.    Elevated liver enzymes:  Right upper quadrant US unremarkable.  Statin on hold.  Acute hepatitis panel is negative.  Repeat panel shows improving liver enzymes.  Continue to hold statin for now.  Recommend LFTs to be checked in the outpatient setting.  Will order labs for today.  Hypertension:  Blood pressure is reasonably well-controlled.  Elevated readings likely due to pain  issues.  Noted to be on metoprolol and nifedipine.    Tobacco abuse:  Counseled on cessation.   Obesity Estimated body mass index is 33.22 kg/m as calculated from the following:   Height as of this encounter: 5\' 7"  (1.702 m).   Weight as of this encounter: 96.2 kg.  Code Status: full code DVT Prophylaxis:  enoxaparin (LOVENOX) injection 40 mg Start: 11/22/22 1000   Level of Care: Level of care: Med-Surg Family Communication: none at bedside  Disposition Plan: Home  Procedures:  CT abd and pelvis.   Consultants:   General surgery.   Antimicrobials:   Anti-infectives (From admission, onward)    Start     Dose/Rate Route Frequency Ordered Stop   11/27/22 1215  amoxicillin-clavulanate (AUGMENTIN) 875-125 MG per tablet 1 tablet        1 tablet Oral Every 12 hours 11/27/22 1120     11/21/22 2330  piperacillin-tazobactam (ZOSYN) IVPB 3.375 g  Status:  Discontinued        3.375 g 12.5 mL/hr over 240 Minutes Intravenous Every 8 hours 11/21/22 2327 11/27/22 1120        Medications  Scheduled Meds:  amoxicillin-clavulanate  1 tablet Oral Q12H   aspirin EC  81 mg Oral QPM   buPROPion  300 mg Oral QHS   enoxaparin (LOVENOX) injection  40 mg Subcutaneous Daily   hydrOXYzine  50 mg Oral QHS   metoprolol succinate  25 mg Oral QPM   nicotine  14 mg Transdermal Daily   NIFEdipine  60 mg Oral QPM   polyethylene glycol  17 g Oral BID   senna-docusate  2 tablet Oral BID   traZODone  50 mg Oral QHS   Continuous Infusions:  lactated ringers 100 mL/hr at 11/27/22 1111   PRN Meds:.acetaminophen **OR** acetaminophen, alum & mag hydroxide-simeth, fentaNYL (SUBLIMAZE) injection, hydrALAZINE, HYDROmorphone (DILAUDID) injection, melatonin, ondansetron (ZOFRAN) IV, oxyCODONE-acetaminophen    Subjective:   Abdominal pain is reasonably well-controlled.  Denies any nausea vomiting this morning.  Had a bowel movement overnight.  Objective:   Vitals:   11/26/22 0812 11/26/22 1632  11/26/22 2158 11/27/22 0832  BP: (!) 144/93 (!) 143/97 (!) 143/86 (!) 129/95  Pulse: 72 83 91 72  Resp: 17 17 17 17   Temp: 98.2 F (36.8 C) 98.2 F (36.8 C) 97.7 F (36.5 C) 97.8 F (36.6 C)  TempSrc:  Oral Oral Oral  SpO2: 100% 100% 99% 100%  Weight:      Height:        Intake/Output Summary (Last 24 hours) at 11/27/2022 1121 Last data filed at 11/27/2022 1111 Gross per 24 hour  Intake 4680 ml  Output 2 ml  Net 4678 ml    Filed Weights   11/21/22 1830  Weight: 96.2 kg    General appearance: Awake alert.  In no distress Resp: Clear to auscultation bilaterally.  Normal effort Cardio: S1-S2 is normal regular.  No S3-S4.  No rubs murmurs or bruit GI: Abdomen is soft.  Nontender nondistended.  Bowel sounds are present normal.  No masses organomegaly Extremities: No edema.  Full range of motion of lower extremities. Neurologic: Alert and oriented x3.  No focal neurological deficits.     Data Reviewed:  I have personally reviewed following labs and imaging studies   CBC Lab Results  Component Value Date   WBC 8.0 11/25/2022   RBC 4.59 11/25/2022   HGB 13.0 11/25/2022   HCT 40.9 11/25/2022   MCV 89.1 11/25/2022   MCH 28.3 11/25/2022   PLT 285 11/25/2022   MCHC 31.8 11/25/2022   RDW 13.4 11/25/2022   LYMPHSABS 1.4 11/24/2022   MONOABS 0.5 11/24/2022   EOSABS 0.2 11/24/2022   BASOSABS 0.0 11/24/2022     Last metabolic panel Lab Results  Component Value Date   NA 140 11/24/2022   K 3.9 11/24/2022   CL 103 11/24/2022   CO2 23 11/24/2022   BUN 5 (L) 11/24/2022   CREATININE 0.68 11/24/2022   GLUCOSE 65 (L) 11/24/2022   GFRNONAA >60 11/24/2022   GFRAA >60 10/03/2019   CALCIUM 8.9 11/24/2022   PROT 7.0 11/25/2022   ALBUMIN 3.3 (L) 11/25/2022   BILITOT 0.3 11/25/2022   ALKPHOS 238 (H) 11/25/2022   AST 86 (H) 11/25/2022   ALT 354 (H) 11/25/2022   ANIONGAP 14 11/24/2022    Radiology Studies: CT ABDOMEN PELVIS W CONTRAST  Result Date: 11/26/2022 CLINICAL  DATA:  52 year old female with a history of suspected diverticulitis EXAM: CT ABDOMEN AND PELVIS WITH CONTRAST TECHNIQUE: Multidetector CT imaging of the abdomen and pelvis was performed using the standard protocol following bolus administration of intravenous contrast. RADIATION DOSE REDUCTION: This exam was performed according  to the departmental dose-optimization program which includes automated exposure control, adjustment of the mA and/or kV according to patient size and/or use of iterative reconstruction technique. CONTRAST:  75mL OMNIPAQUE IOHEXOL 350 MG/ML SOLN COMPARISON:  08/04/2017 Abdominal MRI 03/27/2019 FINDINGS: Lower chest: No acute finding of the lower chest Hepatobiliary: Low-density/nonenhancing cystic structure of the right posterior liver measures 3.4 cm, most compatible with benign biliary cyst. Trace intrahepatic ductal dilatation with no radiopaque stones. Surgical changes of cholecystectomy, likely accounts for the mild ductal dilatation. No inflammatory changes. Unremarkable appearance of the biliary ducts at the head of the pancreas. Pancreas: Unremarkable Spleen: Unremarkable Adrenals/Urinary Tract: Unremarkable adrenal glands. Right kidney unremarkable with no hydronephrosis or nephrolithiasis. No inflammatory changes. Left kidney without hydronephrosis or nephrolithiasis. 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 Unremarkable urinary bladder Stomach/Bowel: Unremarkable stomach.  Unremarkable small bowel. Normal appendix. Moderate to large formed stool burden. Colonic diverticula of the transverse colon through the sigmoid colon. Wall thickening of the sigmoid colon with inflammatory changes of the adjacent fat. No focal fluid or extraluminal air. Mucosal enhancement within normal limits. Vascular/Lymphatic: Minimal atherosclerosis of the abdominal aorta. No aneurysm. Mesenteric  arteries and the renal arteries patent. Iliac arteries and the proximal femoral arteries patent. Unremarkable venous structures. Unremarkable portal venous structures. No adenopathy. Reproductive: Unremarkable uterus and adnexa. Other: Small fat containing umbilical hernia. Musculoskeletal: Degenerative changes of L4-L5 with vacuum disc phenomenon. Posterior disc protrusion with partial calcification without significant canal narrowing. No displaced fracture. No significant hip degenerative changes. IMPRESSION: Acute diverticulitis of the sigmoid colon, without complicating features. Moderate to large formed stool burden. Additional ancillary findings as above. Electronically Signed   By: Gilmer Mor D.O.   On: 11/26/2022 12:13       Osvaldo Shipper M.D. [facility] 11/27/2022, 11:21 AM  Available via Epic secure chat 7am-7pm After 7 pm, please refer to night coverage provider listed on amion.

## 2022-11-27 NOTE — Progress Notes (Signed)
Patient being discharge home, self care. PIV removed, AVS discussed, patient verbalized understanding including recommended diet. Patient left the unit with all belongings, in NAD.

## 2022-11-27 NOTE — Plan of Care (Signed)

## 2022-11-27 NOTE — Plan of Care (Signed)
  Problem: Health Behavior/Discharge Planning: Goal: Ability to manage health-related needs will improve Outcome: Progressing   Problem: Clinical Measurements: Goal: Will remain free from infection Outcome: Progressing Goal: Respiratory complications will improve Outcome: Progressing Goal: Cardiovascular complication will be avoided Outcome: Progressing   Problem: Nutrition: Goal: Adequate nutrition will be maintained Outcome: Progressing   Problem: Elimination: Goal: Will not experience complications related to bowel motility Outcome: Progressing

## 2022-11-27 NOTE — Progress Notes (Signed)
Subjective: CC: Feeling better. L sided abdominal pain has improved to a 3/10 this am. Was requiring IV pain medication yesterday that made her nauseated. Tolerating soft diet otherwise and about to eat breakfast. No vomiting. 2 formed bm's yesterday.   Objective: Vital signs in last 24 hours: Temp:  [97.7 F (36.5 C)-98.2 F (36.8 C)] 97.8 F (36.6 C) (09/25 0832) Pulse Rate:  [72-91] 72 (09/25 0832) Resp:  [17] 17 (09/25 0832) BP: (129-143)/(86-97) 129/95 (09/25 0832) SpO2:  [99 %-100 %] 100 % (09/25 0832) Last BM Date : 11/26/22  Intake/Output from previous day: 09/24 0701 - 09/25 0700 In: 1440 [P.O.:1440] Out: 3 [Urine:3] Intake/Output this shift: No intake/output data recorded.  PE: Gen:  Alert, NAD, pleasant Abd: Soft, ND, mild left sided ttp on exam today that is improved from yesterday. No rigidity or guarding, +BS  Lab Results:  Recent Labs    11/25/22 0846  WBC 8.0  HGB 13.0  HCT 40.9  PLT 285   BMET No results for input(s): "NA", "K", "CL", "CO2", "GLUCOSE", "BUN", "CREATININE", "CALCIUM" in the last 72 hours.  PT/INR No results for input(s): "LABPROT", "INR" in the last 72 hours. CMP     Component Value Date/Time   NA 140 11/24/2022 0236   K 3.9 11/24/2022 0236   CL 103 11/24/2022 0236   CO2 23 11/24/2022 0236   GLUCOSE 65 (L) 11/24/2022 0236   BUN 5 (L) 11/24/2022 0236   CREATININE 0.68 11/24/2022 0236   CALCIUM 8.9 11/24/2022 0236   PROT 7.0 11/25/2022 0224   ALBUMIN 3.3 (L) 11/25/2022 0224   AST 86 (H) 11/25/2022 0224   ALT 354 (H) 11/25/2022 0224   ALKPHOS 238 (H) 11/25/2022 0224   BILITOT 0.3 11/25/2022 0224   GFRNONAA >60 11/24/2022 0236   GFRAA >60 10/03/2019 1225   Lipase     Component Value Date/Time   LIPASE 23 11/21/2022 1833    Studies/Results: CT ABDOMEN PELVIS W CONTRAST  Result Date: 11/26/2022 CLINICAL DATA:  52 year old female with a history of suspected diverticulitis EXAM: CT ABDOMEN AND PELVIS WITH  CONTRAST TECHNIQUE: Multidetector CT imaging of the abdomen and pelvis was performed using the standard protocol following bolus administration of intravenous contrast. RADIATION DOSE REDUCTION: This exam was performed according to the departmental dose-optimization program which includes automated exposure control, adjustment of the mA and/or kV according to patient size and/or use of iterative reconstruction technique. CONTRAST:  75mL OMNIPAQUE IOHEXOL 350 MG/ML SOLN COMPARISON:  08/04/2017 Abdominal MRI 03/27/2019 FINDINGS: Lower chest: No acute finding of the lower chest Hepatobiliary: Low-density/nonenhancing cystic structure of the right posterior liver measures 3.4 cm, most compatible with benign biliary cyst. Trace intrahepatic ductal dilatation with no radiopaque stones. Surgical changes of cholecystectomy, likely accounts for the mild ductal dilatation. No inflammatory changes. Unremarkable appearance of the biliary ducts at the head of the pancreas. Pancreas: Unremarkable Spleen: Unremarkable Adrenals/Urinary Tract: Unremarkable adrenal glands. Right kidney unremarkable with no hydronephrosis or nephrolithiasis. No inflammatory changes. Left kidney without hydronephrosis or nephrolithiasis. 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 Unremarkable urinary bladder Stomach/Bowel: Unremarkable stomach.  Unremarkable small bowel. Normal appendix. Moderate to large formed stool burden. Colonic diverticula of the transverse colon through the sigmoid colon. Wall thickening of the sigmoid colon with inflammatory changes of the adjacent fat. No focal fluid or extraluminal air. Mucosal enhancement within normal limits. Vascular/Lymphatic: Minimal atherosclerosis  of the abdominal aorta. No aneurysm. Mesenteric arteries and the renal arteries patent. Iliac arteries and the proximal femoral arteries patent. Unremarkable  venous structures. Unremarkable portal venous structures. No adenopathy. Reproductive: Unremarkable uterus and adnexa. Other: Small fat containing umbilical hernia. Musculoskeletal: Degenerative changes of L4-L5 with vacuum disc phenomenon. Posterior disc protrusion with partial calcification without significant canal narrowing. No displaced fracture. No significant hip degenerative changes. IMPRESSION: Acute diverticulitis of the sigmoid colon, without complicating features. Moderate to large formed stool burden. Additional ancillary findings as above. Electronically Signed   By: Gilmer Mor D.O.   On: 11/26/2022 12:13    Anti-infectives: Anti-infectives (From admission, onward)    Start     Dose/Rate Route Frequency Ordered Stop   11/21/22 2330  piperacillin-tazobactam (ZOSYN) IVPB 3.375 g        3.375 g 12.5 mL/hr over 240 Minutes Intravenous Every 8 hours 11/21/22 2327          Assessment/Plan Sigmoid diverticulitis with abscess - CT w/ 9/19 done at outside hospital with Acute sigmoid colitis/diverticulitis with ill-defined, low-attenuation focus within the inflamed segment, concerning for small intramural abscess. No evidence of perforation or obstruction. Would not recommend IR drainage as this was intramural - CT 9/24 with acute diverticulitis of the sigmoid colon, without complicating features. - Afebrile. No tachycardia or hypotension. WBC wnl on last check. No peritonitis on exam. No current indication for emergency surgery - If patients is tolerating soft diet without worsening abdominal pain or n/v and her pain is well controlled on po medications today she is okay for d/c from our standpoint on course of PO abx such as Augmentin (would recommend 10-14d course total). Recommend colonoscopy in ~4-6 weeks. Discussed return precautions. Will reach out to [facility] to let them know.   - We will follow with you  FEN - Soft diet. IVF per [facility] VTE - SCDs, ASA, Lovenox ID - Zosyn  I reviewed  nursing notes, last 24 h vitals and pain scores, last 48 h intake and output, last 24 h labs and trends, and last 24 h imaging results.   LOS: 5 days    Jacinto Halim , [facility] 11/27/2022, 10:10 AM Please see Amion for pager number during day hours 7:00am-4:30pm

## 2022-11-27 NOTE — Plan of Care (Signed)

## 2022-11-27 NOTE — Discharge Summary (Signed)
[facility]  Physician Discharge Summary   Patient ID: Sheila Walker MRN: [id number] DOB/AGE: 03-May-1970 52 y.o.  Admit date: 11/21/2022 Discharge date: 11/27/2022    PCP: Tacey Ruiz, FNP  DISCHARGE DIAGNOSES:    Acute diverticulitis of intestine   Hypertension, benign   OSA (obstructive sleep apnea)   Tobacco use disorder   Dyslipidemia   CAD (coronary artery disease)   RECOMMENDATIONS FOR OUTPATIENT FOLLOW UP: Patient to be seen by gastroenterology in the outpatient setting to consider colonoscopy LFTs to be checked in the outpatient setting in the next 1 to 2 weeks.    Home Health: None Equipment/Devices: None  CODE STATUS: Full code  DISCHARGE CONDITION: fair  Diet recommendation: As before  INITIAL HISTORY: 52 year old female with past medical history of HLD, HTN, OSA, tobacco use. For the past 2 weeks she has noticed abd pain with some nausea . No vomiting. She underwent CT abd and pelvis showing diverticulitis with possible small abscess formation. She was directed to the ER.  Gen surgery consulted, recommended to watch with IV antibiotics, IV fluids.    HOSPITAL COURSE:   Acute sigmoid diverticulitis Patient was hospitalized.  Started on conservative management.  Given IV fluids and IV antibiotics.  Seen by general surgery.  CT scan repeated and does not show any worsening signs.  No abscess.  Seen by general surgery and cleared for discharge.  Transitioned to oral antibiotics.  CT scan did suggest significant constipation.  Patient was started on laxatives yesterday.  And she has had a bowel movement.  This has been discussed in detail with the patient.  She plans to increase fiber in her diet. Last colonoscopy was about 5 years ago.  Sigmoid polyp was noted.  Diverticulosis and internal hemorrhoids were noted.  General surgery recommends colonoscopy.  Will asked the patient to call her gastroenterologist for follow-up.  Ambulatory  referral was sent to GI by general surgery.   Elevated liver enzymes:  Right upper quadrant US unremarkable.  Statin on hold.  Acute hepatitis panel is negative.  LFTs almost back to normal.  Okay to continue statin at this time.   Hypertension:  Blood pressure is reasonably well-controlled.  Elevated readings likely due to pain issues.  Noted to be on metoprolol and nifedipine.     Tobacco abuse:  Counseled on cessation.    Obesity Estimated body mass index is 33.22 kg/m as calculated from the following:   Height as of this encounter: 5\' 7"  (1.702 m).   Weight as of this encounter: 96.2 kg.   Patient is stable.  Okay for discharge home today as she has been cleared by General surgery.  PERTINENT LABS:  The results of significant diagnostics from this hospitalization (including imaging, microbiology, ancillary and laboratory) are listed below for reference.     Labs:   Basic Metabolic Panel: Recent Labs  Lab 11/21/22 1833 11/22/22 1056 11/24/22 0236 11/27/22 1201  NA 134* 137 140 137  K 3.9 3.6 3.9 3.8  CL 102 106 103 101  CO2 21* 23 23 27   GLUCOSE 127* 114* 65* 159*  BUN 7 6 5* 11  CREATININE 0.71 0.62 0.68 0.77  CALCIUM 9.1 8.3* 8.9 9.1   Liver Function Tests: Recent Labs  Lab 11/21/22 1833 11/22/22 1056 11/23/22 1623 11/25/22 0224 11/27/22 1201  AST 14* 110* 187* 86* 30  ALT 13 154* 382* 354* 145*  ALKPHOS 75 141* 260* 238* 146*  BILITOT 0.5 0.9 0.5 0.3 0.3  PROT 7.2 6.3*  7.0 7.0 6.2*  ALBUMIN 3.7 3.1* 3.2* 3.3* 3.0*   Recent Labs  Lab 11/21/22 1833  LIPASE 23   CBC: Recent Labs  Lab 11/21/22 1833 11/23/22 0234 11/24/22 0236 11/25/22 0846  WBC 16.2* 7.9 6.0 8.0  NEUTROABS  --  5.8 3.8  --   HGB 13.9 12.5 12.0 13.0  HCT 43.3 39.3 37.7 40.9  MCV 91.0 89.5 90.2 89.1  PLT 272 228 250 285      IMAGING STUDIES CT ABDOMEN PELVIS W CONTRAST  Result Date: 11/26/2022 CLINICAL DATA:  52 year old female with a history of suspected  diverticulitis EXAM: CT ABDOMEN AND PELVIS WITH CONTRAST TECHNIQUE: Multidetector CT imaging of the abdomen and pelvis was performed using the standard protocol following bolus administration of intravenous contrast. RADIATION DOSE REDUCTION: This exam was performed according to the departmental dose-optimization program which includes automated exposure control, adjustment of the mA and/or kV according to patient size and/or use of iterative reconstruction technique. CONTRAST:  75mL OMNIPAQUE IOHEXOL 350 MG/ML SOLN COMPARISON:  08/04/2017 Abdominal MRI 03/27/2019 FINDINGS: Lower chest: No acute finding of the lower chest Hepatobiliary: Low-density/nonenhancing cystic structure of the right posterior liver measures 3.4 cm, most compatible with benign biliary cyst. Trace intrahepatic ductal dilatation with no radiopaque stones. Surgical changes of cholecystectomy, likely accounts for the mild ductal dilatation. No inflammatory changes. Unremarkable appearance of the biliary ducts at the head of the pancreas. Pancreas: Unremarkable Spleen: Unremarkable Adrenals/Urinary Tract: Unremarkable adrenal glands. Right kidney unremarkable with no hydronephrosis or nephrolithiasis. No inflammatory changes. Left kidney without hydronephrosis or nephrolithiasis. 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 Unremarkable urinary bladder Stomach/Bowel: Unremarkable stomach.  Unremarkable small bowel. Normal appendix. Moderate to large formed stool burden. Colonic diverticula of the transverse colon through the sigmoid colon. Wall thickening of the sigmoid colon with inflammatory changes of the adjacent fat. No focal fluid or extraluminal air. Mucosal enhancement within normal limits. Vascular/Lymphatic: Minimal atherosclerosis of the abdominal aorta. No aneurysm. Mesenteric arteries and the renal arteries patent. Iliac arteries and  the proximal femoral arteries patent. Unremarkable venous structures. Unremarkable portal venous structures. No adenopathy. Reproductive: Unremarkable uterus and adnexa. Other: Small fat containing umbilical hernia. Musculoskeletal: Degenerative changes of L4-L5 with vacuum disc phenomenon. Posterior disc protrusion with partial calcification without significant canal narrowing. No displaced fracture. No significant hip degenerative changes. IMPRESSION: Acute diverticulitis of the sigmoid colon, without complicating features. Moderate to large formed stool burden. Additional ancillary findings as above. Electronically Signed   By: Gilmer Mor D.O.   On: 11/26/2022 12:13   US Abdomen Limited  Result Date: 11/22/2022 CLINICAL DATA:  Elevated liver enzymes EXAM: ULTRASOUND ABDOMEN LIMITED RIGHT UPPER QUADRANT COMPARISON:  None Available. FINDINGS: Gallbladder: Surgically absent Common bile duct: Diameter: 8 mm Liver: No focal lesion identified. Within normal limits in parenchymal echogenicity. 3.0 x 3.2 x 2.9 cm anechoic lesion in right hepatic lobe, cyst. Portal vein is patent on color Doppler imaging with normal direction of blood flow towards the liver. Other: None. IMPRESSION: Prior cholecystectomy.  No duct dilatation. Electronically Signed   By: Karen Kays M.D.   On: 11/22/2022 17:42    DISCHARGE EXAMINATION: See progress note from earlier today  DISPOSITION: Home  Discharge Instructions     Ambulatory referral to Gastroenterology   Complete by: As directed    What is the reason for referral?: Colonoscopy   Call MD for:  difficulty breathing, headache  or visual disturbances   Complete by: As directed    Call MD for:  extreme fatigue   Complete by: As directed    Call MD for:  persistant dizziness or light-headedness   Complete by: As directed    Call MD for:  persistant nausea and vomiting   Complete by: As directed    Call MD for:  severe uncontrolled pain   Complete by: As directed     Call MD for:  temperature >100.4   Complete by: As directed    Discharge instructions   Complete by: As directed    Take your medications as prescribed.  General surgery is recommended that you be about due for a colonoscopy in the next 1 to 2 months.  You can talk to your primary care about this.  Take laxatives and stool softeners as discussed.  Increase the fiber in diet as discussed.  Eat a soft bland diet for the next few days and then [place] bands as tolerated.  You were cared for by a hospitalist during your hospital stay. If you have any questions about your discharge medications or the care you received while you were in the hospital after you are discharged, you can call the unit and asked to speak with the hospitalist on call if the hospitalist that took care of you is not available. Once you are discharged, your primary care physician will handle any further medical issues. Please note that NO REFILLS for any discharge medications will be authorized once you are discharged, as it is imperative that you return to your primary care physician (or establish a relationship with a primary care physician if you do not have one) for your aftercare needs so that they can reassess your need for medications and monitor your lab values. If you do not have a primary care physician, you can call [phone] for a physician referral.   Increase activity slowly   Complete by: As directed          Allergies as of 11/27/2022       Reactions   Amlodipine Swelling   Patient also reports increased heart rate Patient also reports increased heart rate   Azithromycin Hives   Lisinopril Anaphylaxis   Cough Cough   Isosorbide Nitrate    Migraine with nausea    Etodolac Rash        Medication List     STOP taking these medications    polyethylene glycol powder 17 GM/SCOOP powder Commonly known as: GLYCOLAX/MIRALAX Replaced by: polyethylene glycol 17 g packet       TAKE these medications     amoxicillin-clavulanate 875-125 MG tablet Commonly known as: AUGMENTIN Take 1 tablet by mouth every 12 (twelve) hours for 10 days.   aspirin EC 81 MG tablet Take 81 mg by mouth every evening. Swallow whole.   atorvastatin 20 MG tablet Commonly known as: LIPITOR Take 20 mg by mouth every evening. What changed: Another medication with the same name was removed. Continue taking this medication, and follow the directions you see here.   buPROPion 150 MG 12 hr tablet Commonly known as: WELLBUTRIN SR Take 300 mg by mouth at bedtime.   CALCIUM PO Take 1 tablet by mouth every evening.   cyclobenzaprine 5 MG tablet Commonly known as: FLEXERIL TAKE 1 TABLET(5 MG) BY MOUTH THREE TIMES DAILY AS NEEDED FOR MUSCLE SPASMS   Fish Oil 300 MG Caps Take 1 capsule by mouth every evening.   hydrOXYzine 25 MG tablet  Commonly known as: ATARAX Take 50 mg by mouth at bedtime.   magnesium 30 MG tablet Take 30 mg by mouth every evening.   Melatonin 3 MG Tbdp Take 1 tablet by mouth at bedtime as needed.   meloxicam 7.5 MG tablet Commonly known as: MOBIC Take 7.5 mg by mouth every evening.   metoprolol succinate 25 MG 24 hr tablet Commonly known as: TOPROL-XL TAKE 1 TABLET(25 MG) BY MOUTH IN THE MORNING AND AT BEDTIME What changed: See the new instructions.   NIFEdipine 30 MG 24 hr tablet Commonly known as: PROCARDIA-XL/NIFEDICAL-XL Take 30 mg by mouth every evening.   oxyCODONE-acetaminophen 5-325 MG tablet Commonly known as: PERCOCET/ROXICET Take 1 tablet by mouth every 6 (six) hours as needed for severe pain.   polyethylene glycol 17 g packet Commonly known as: MIRALAX / GLYCOLAX Take 17 g by mouth 2 (two) times daily. Replaces: polyethylene glycol powder 17 GM/SCOOP powder   senna-docusate 8.6-50 MG tablet Commonly known as: Senokot-S Take 2 tablets by mouth 2 (two) times daily.   traZODone 50 MG tablet Commonly known as: DESYREL Take 50 mg by mouth at bedtime.            TOTAL DISCHARGE TIME: 35 minutes  Kenda Kloehn [employer] on [email or web address]  11/28/2022, 10:34 AM

## 2022-12-11 ENCOUNTER — Encounter: Payer: Self-pay | Admitting: Physician Assistant

## 2023-01-09 ENCOUNTER — Other Ambulatory Visit: Payer: Self-pay | Admitting: Unknown Physician Specialty

## 2023-01-09 DIAGNOSIS — J9589 Other postprocedural complications and disorders of respiratory system, not elsewhere classified: Secondary | ICD-10-CM

## 2023-01-15 ENCOUNTER — Encounter: Payer: Self-pay | Admitting: Gastroenterology

## 2023-01-21 ENCOUNTER — Ambulatory Visit: Payer: Disability Insurance | Attending: Unknown Physician Specialty

## 2023-03-17 ENCOUNTER — Ambulatory Visit (INDEPENDENT_AMBULATORY_CARE_PROVIDER_SITE_OTHER): Admitting: Physician Assistant

## 2023-03-17 ENCOUNTER — Encounter: Payer: Self-pay | Admitting: Physician Assistant

## 2023-03-17 VITALS — BP 112/70 | HR 89 | Ht 67.0 in | Wt 210.0 lb

## 2023-03-17 DIAGNOSIS — K5909 Other constipation: Secondary | ICD-10-CM

## 2023-03-17 DIAGNOSIS — K5792 Diverticulitis of intestine, part unspecified, without perforation or abscess without bleeding: Secondary | ICD-10-CM | POA: Diagnosis not present

## 2023-03-17 DIAGNOSIS — Z860101 Personal history of adenomatous and serrated colon polyps: Secondary | ICD-10-CM

## 2023-03-17 DIAGNOSIS — K5900 Constipation, unspecified: Secondary | ICD-10-CM

## 2023-03-17 MED ORDER — SUFLAVE 178.7 G PO SOLR: 1.0000 | Freq: Once | ORAL | 0 refills | Status: AC

## 2023-03-17 NOTE — Progress Notes (Signed)
 Subjective:    Patient ID: Sheila Walker, female    DOB: 02-12-1971, 53 y.o.   MRN: [id number]  HPI Sheila Walker is a 53 year old white female, established with Dr. Charlanne.  She was last seen in our office in 2019 when she underwent colonoscopy for follow-up of history of multiple adenomatous colon polyps. She was found to have multiple small diverticuli throughout the entire colon, she had a 10 mm polyp at 25 cm which was removed and noted to have grade 2 internal hemorrhoids.  Path on the polyp was consistent with a hyperplastic polyp and she was indicated for 5-year interval follow-up due to her prior history. She comes in today for follow-up after she had an ER visit in September 2024 with acute left-sided abdominal pain.  She was diagnosed with acute diverticulitis with CT at that time showing sigmoid diverticulitis, no focal fluid collection or extraluminal air.  She was also noted to have moderate to large amount of retained stool, as well as a low-density nonenhancing cystic structure of the right posterior liver measuring 3.4 centimeters compatible with a benign biliary cyst, there is trace intrahepatic ductal dilation no radiopaque stones and surgical changes of cholecystectomy. She was treated with a course of antibiotics with resolution of symptoms.  She thinks that this episode was aggravated by fasting which she was doing as a diet.  She says that she was fairly significantly constipated at that time prior to onset of the pain. She says she has also been seen by urogynecology within the past couple of months and is felt to have a bladder prolapse.  She says she was given a pessary but is not wearing it at this time.  She feels that she is has some sort of blockage which is also affecting her ability to defecate.  She says there is some sort of a hump that the stool has to get past internally.  She has been taking MiraLAX  on a regular basis over the past couple of months and says that  does seem to help her be more regular.  She did have questions about her diet as well.  She has upcoming follow-up with urogynecology who wanted her to be seen by GI apparently prior to decisions about any surgery. Patient also has history of hypertension, coronary artery disease, sleep apnea without oxygen use and osteoarthritis. She is not on any blood thinners. She does take pain medication on a regular basis which is certainly contributing to her constipation.  Review of Systems Pertinent positive and negative review of systems were noted in the above HPI section.  All other review of systems was otherwise negative.   Outpatient Encounter Medications as of 03/17/2023  Medication Sig   aspirin  EC 81 MG tablet Take 81 mg by mouth every evening. Swallow whole.   atorvastatin  (LIPITOR) 20 MG tablet Take 20 mg by mouth every evening.   buPROPion  (WELLBUTRIN  SR) 150 MG 12 hr tablet Take 300 mg by mouth at bedtime.   hydrOXYzine  (ATARAX ) 25 MG tablet Take 50 mg by mouth at bedtime.   Melatonin 3 MG TBDP Take 1 tablet by mouth at bedtime as needed.   meloxicam (MOBIC) 7.5 MG tablet Take 7.5 mg by mouth every evening.   metoprolol  succinate (TOPROL -XL) 25 MG 24 hr tablet TAKE 1 TABLET(25 MG) BY MOUTH IN THE MORNING AND AT BEDTIME (Patient taking differently: Take 25 mg by mouth every evening.)   NIFEdipine  (PROCARDIA -XL/NIFEDICAL-XL) 30 MG 24 hr tablet Take 30 mg by  mouth every evening.   Omega-3 Fatty Acids (FISH OIL) 300 MG CAPS Take 1 capsule by mouth every evening.   OVER THE COUNTER MEDICATION Pt states she is taking a multivitamin that contain iron and magnesium   PEG 3350 -KCl-NaCl-NaSulf-MgSul (SUFLAVE ) 178.7 g SOLR Take 1 kit by mouth once for 1 dose.   senna-docusate (SENOKOT-S) 8.6-50 MG tablet Take 2 tablets by mouth 2 (two) times daily.   traZODone  (DESYREL ) 50 MG tablet Take 50 mg by mouth at bedtime.   CALCIUM  PO Take 1 tablet by mouth every evening. (Patient not taking: Reported on  03/17/2023)   cyclobenzaprine (FLEXERIL) 5 MG tablet TAKE 1 TABLET(5 MG) BY MOUTH THREE TIMES DAILY AS NEEDED FOR MUSCLE SPASMS (Patient not taking: Reported on 03/17/2023)   magnesium 30 MG tablet Take 30 mg by mouth every evening. (Patient not taking: Reported on 03/17/2023)   oxyCODONE -acetaminophen  (PERCOCET/ROXICET) 5-325 MG tablet Take 1 tablet by mouth every 6 (six) hours as needed for severe pain. (Patient not taking: Reported on 03/17/2023)   polyethylene glycol (MIRALAX  / GLYCOLAX ) 17 g packet Take 17 g by mouth 2 (two) times daily. (Patient not taking: Reported on 03/17/2023)   No facility-administered encounter medications on file as of 03/17/2023.   Allergies  Allergen Reactions   Amlodipine Swelling    Patient also reports increased heart rate Patient also reports increased heart rate    Azithromycin Hives   Lisinopril Anaphylaxis    Cough Cough    Isosorbide Nitrate     Migraine with nausea    Etodolac Rash   Patient Active Problem List   Diagnosis Date Noted   Acute diverticulitis of intestine 11/22/2022   CAD (coronary artery disease) 11/22/2022   Educated about COVID-19 virus infection 12/09/2019   Coronary artery disease involving native coronary artery of native heart without angina pectoris 12/09/2019   Dyslipidemia 09/08/2019   Secondary hypertension 09/08/2019   OSA (obstructive sleep apnea) 04/27/2017   Palpitations 04/13/2017   Abnormal stress test 09/27/2016   Abnormal uterine bleeding 02/20/2015   Cervical polyp 02/20/2015   Influenza vaccination declined 01/31/2015   Bowel dysfunction 06/22/2013   Depression screening 04/14/2013   Immunization counseling 03/05/2013   Right knee pain 03/05/2013   Low back pain 07/16/2012   Neck pain 07/16/2012   Primary localized osteoarthrosis of hand 02/15/2011   Abnormal glandular Papanicolaou smear of cervix 06/15/2010   Hypertension, benign 06/01/2010   Tobacco use disorder 06/01/2010   Social History    Socioeconomic History   Marital status: Widowed    Spouse name: Not on file   Number of children: 0   Years of education: Not on file   Highest education level: Not on file  Occupational History   Occupation: disable  Tobacco Use   Smoking status: Every Day    Current packs/day: 0.75    Types: Cigarettes   Smokeless tobacco: Never  Vaping Use   Vaping status: Never Used  Substance and Sexual Activity   Alcohol use: No   Drug use: Yes    Types: Marijuana    Comment: 1-2 times per week per pt   Sexual activity: Not on file  Other Topics Concern   Not on file  Social History Narrative   Works.  Lives with partner.     Social Drivers of Corporate Investment Banker Strain: Low Risk  (01/18/2020)   Received from [facility], [facility]   Overall Financial Resource Strain (CARDIA)    Difficulty  of Paying Living Expenses: Not very hard  Food Insecurity: No Food Insecurity (11/23/2022)   Hunger Vital Sign    Worried About Running Out of Food in the Last Year: Never true    Ran Out of Food in the Last Year: Never true  Recent Concern: Food Insecurity - Food Insecurity Present (10/25/2022)   Received from [facility]   Hunger Vital Sign    Worried About Running Out of Food in the Last Year: Never true    Ran Out of Food in the Last Year: Sometimes true  Transportation Needs: No Transportation Needs (11/23/2022)   PRAPARE - Administrator, Civil Service (Medical): No    Lack of Transportation (Non-Medical): No  Physical Activity: Unknown (01/18/2020)   Received from [facility], [facility]   Exercise Vital Sign    Days of Exercise per Week: 0 days    Minutes of Exercise per Session: Not on file  Stress: Stress Concern Present (01/18/2020)   Received from [facility], [facility] of Occupational Health - Occupational Stress Questionnaire    Feeling of Stress : Rather much  Social Connections: Unknown (01/18/2020)    Received from [facility], [facility]   Social Connection and Isolation Panel [NHANES]    Frequency of Communication with Friends and Family: More than three times a week    Frequency of Social Gatherings with Friends and Family: More than three times a week    Attends Religious Services: Not on file    Active Member of Clubs or Organizations: Not on file    Attends Banker Meetings: Not on file    Marital Status: Not on file  Intimate Partner Violence: Not At Risk (11/23/2022)   Humiliation, Afraid, Rape, and Kick questionnaire    Fear of Current or Ex-Partner: No    Emotionally Abused: No    Physically Abused: No    Sexually Abused: No    Sheila Walker's family history includes Colon cancer (age of onset: 33) in her maternal grandfather and maternal uncle.      Objective:    Vitals:   03/17/23 1139  BP: 112/70  Pulse: 89    Physical Exam Well-developed well-nourished white female in no acute distress.  Height, Weight, 210 BMI 32.89  HEENT; nontraumatic normocephalic, EOMI, PE R LA, sclera anicteric. Oropharynx; not examined today Neck; supple, no JVD Cardiovascular; regular rate and rhythm with S1-S2, no murmur rub or gallop Pulmonary; Clear bilaterally Abdomen; soft, nontender, obese, nondistended, no palpable mass or hepatosplenomegaly, bowel sounds are active Rectal; not done today Skin; benign exam, no jaundice rash or appreciable lesions Extremities; no clubbing cyanosis or edema skin warm and dry Neuro/Psych; alert and oriented x4, grossly nonfocal mood and affect appropriate        Assessment & Plan:   #40 53 year old white female with known pandiverticulosis, here today for follow-up after an admit in September 2024 at which time she was diagnosed with diverticulitis by CT.  That episode likely exacerbated by obstipation as she was fasting and constipated.  #2 chronic constipation #3 history of multiple adenomatous colon polyps-overdue  for follow-up colonoscopy which was indicated at a 5-year interval-last exam March 2019. #4 difficulty evacuating bowels-patient is not a great historian but says she had been seen by urogynecology and has a bladder prolapse, had been fitted with a pessary which she is not using currently, I am not certain whether or  not she was also diagnosed with a rectocele.  #5 history of coronary artery disease #6 sleep apnea no oxygen use #7.  Hypertension #8.  Chronic pain syndrome #9.  Status post cholecystectomy #10  Benign right hepatic lobe cyst noted on CT September 2024  Plan; patient is signed a release and will try to obtain copies from her PCP of the urogynecology consultation. We discussed using MiraLAX  17 g in 8 ounces of water on an every day basis. Add fiber supplement daily, suggested Benefiber. Patient will be scheduled for Colonoscopy with Dr. Charlanne.  Procedure was discussed in detail with the patient including indications risks and benefits and she is agreeable to proceed.  Addendum--I reviewed her cardiac history further, she is followed by Dr. Lavona has not been seen since 2023-she did have a positive exercise stress test at that time and frequent ectopy and was supposed to get a urinary CT but I do not see that that has been done. I think we need to get her back into cardiology for reevaluation prior to colonoscopy which is not urgent at this time.  I will get in touch with the patient, and we will get her an appointment with cardiology/Dr. Lavona, and canceled the colonoscopy until she has clearance from cardiology.  Zlatan Hornback GORMAN Corti PA-C 03/17/2023   Cc: Dolleschel, Damien, FNP

## 2023-03-17 NOTE — Patient Instructions (Addendum)
 You have been scheduled for a colonoscopy. Please follow written instructions given to you at your visit today.   Please pick up your prep supplies at the pharmacy within the next 1-3 days.  If you use inhalers (even only as needed), please bring them with you on the day of your procedure.  DO NOT TAKE 7 DAYS PRIOR TO TEST- Trulicity (dulaglutide) Ozempic, Wegovy (semaglutide) Mounjaro (tirzepatide) Bydureon Bcise (exanatide extended release)  DO NOT TAKE 1 DAY PRIOR TO YOUR TEST Rybelsus (semaglutide) Adlyxin (lixisenatide) Victoza (liraglutide) Byetta (exanatide) ___________________________________________________________________________  Rosine will receive your bowel preparation through [facility], which ensures the lowest copay and home delivery, with outreach via text or call from an [phone] number. Please respond promptly to avoid rescheduling. If you are interested in alternative options or have any questions please contact them at [phone]  Your Provider Has Sent Your Bowel Prep Regimen To [facility] What to expect. [facility] will contact you to verify your information and collect your copay, if applicable. Enjoy the comfort of your home while we deliver your prescription to you, free of any shipping charges. Fast, FREE delivery or shipping. [facility] accepts all major insurance benefits and applies discounts & coupons  Have additional questions? [facility]'s patient care team is always here to help.  Chat: [email or web address] Call: [phone] Email: [email or web address] NCPDP: [id number] How will we contact you? Welcome Phone call  a Welcome text and a Checkout link in a text Texts you receive from [phone] Are Not Spam.  Start Miralax  daily or every other day Start taking Benefiber daily  *To set up delivery, you must complete the checkout process via link or speak to one of our patient care representatives. If we are unable to reach you, your  prescription may be delayed.   _______________________________________________________  If your blood pressure at your visit was 140/90 or greater, please contact your primary care physician to follow up on this.  _______________________________________________________  If you are age 65 or older, your body mass index should be between 23-30. Your Body mass index is 32.89 kg/m. If this is out of the aforementioned range listed, please consider follow up with your Primary Care Provider.  If you are age 76 or younger, your body mass index should be between 19-25. Your Body mass index is 32.89 kg/m. If this is out of the aformentioned range listed, please consider follow up with your Primary Care Provider.   ________________________________________________________  The [facility] providers would like to encourage you to use MYCHART to communicate with providers for non-urgent requests or questions.  Due to long hold times on the telephone, sending your provider a message by [facility] may be a faster and more efficient way to get a response.  Please allow 48 business hours for a response.  Please remember that this is for non-urgent requests.  _______________________________________________________  Due to recent changes in healthcare laws, you may see the results of your imaging and laboratory studies on MyChart before your provider has had a chance to review them.  We understand that in some cases there may be results that are confusing or concerning to you. Not all laboratory results come back in the same time frame and the provider may be waiting for multiple results in order to interpret others.  Please give us  48 hours in order for your provider to thoroughly review all the results before contacting the office for clarification of your results.   Thank you for entrusting me with your care and choosing [facility].  Greig Corti PA-C

## 2023-03-18 ENCOUNTER — Telehealth: Payer: Self-pay

## 2023-03-18 NOTE — Telephone Encounter (Signed)
 [facility] Pre-operative Risk Assessment     Request for surgical clearance:     Endoscopy Procedure  What type of surgery is being performed?     colonoscopy  When is this surgery scheduled?     05/08/23  What type of clearance is required ?   Medical  Are there any medications that need to be held prior to surgery and how long? No  Practice name and name of physician performing surgery?      [facility] by Dr Charlanne  What is your office phone and fax number?      Phone- [phone]  Fax- [phone]  Anesthesia type (None, local, MAC, general) ?       MAC   Please route your response to Almarie Goo, LPN

## 2023-03-19 NOTE — Telephone Encounter (Signed)
 Notes have now been routed to surgeon's office for appropriate cardiologist for clearance. Faxed to:  [facility] Fax #: [phone]

## 2023-03-19 NOTE — Telephone Encounter (Signed)
 Patient is not being seen by [facility] for cardiology any longer.  She is being followed by [facility] cardiology, Dr.Aghajanian.  Please have surgeon reach out to appropriate cardiologist for clearance.  Thank you estimation point

## 2023-03-20 ENCOUNTER — Telehealth: Payer: Self-pay

## 2023-03-20 NOTE — Telephone Encounter (Signed)
Pre-operative Risk Assessment     Request for surgical clearance:     Endoscopy Procedure  What type of surgery is being performed?     colonoscopy  When is this surgery scheduled?     05/08/23  What type of clearance is required ? Medical clearance for colonoscopy   Practice name and name of physician performing surgery?      [facility] with Dr Lynann Bologna  What is your office phone and fax number?      Phone- [phone]  Fax- [phone]  Anesthesia type (None, local, MAC, general) ?       MAC   Please route your response to [phone] attention [facility]

## 2023-03-20 NOTE — Telephone Encounter (Signed)
Requested for clearance faxed to  Dr Lorretta Harp  [address]   [address] [place] [address]  Phone: tel:[phone]  fax:[phone]

## 2023-04-02 ENCOUNTER — Telehealth: Payer: Self-pay

## 2023-04-02 NOTE — Telephone Encounter (Addendum)
-----   Message from Mike Gip sent at 03/19/2023  1:32 PM EST ----- Regarding: RE: colonoscopy- CARDIAC WORKUP We can , but she did not complete the workup that was suggested by Cardiology previously or it didn't get scheduled so she cannot have procedure until further workup done ----- Message ----- From: Evalee Jefferson, LPN Sent: 5/62/1308   1:01 PM EST To: Sammuel Cooper, PA-C Subject: RE: colonoscopy- CARDIAC WORKUP                I have reached out to cardiology asking for clearance. The colonoscopy is scheduled for March. Can I leave it until we know what cardiology thinks? ----- Message ----- From: Peterson Ao Sent: 03/17/2023   5:26 PM EST To: Evalee Jefferson, LPN; Lynann Bologna, MD Subject: colonoscopy- CARDIAC WORKUP                    Beth, I saw this patient today in the office, and scheduled her for a colonoscopy with Dr. Chales Abrahams this was after an episode of diverticulitis in the fall.  I reviewed her chart carefully after the visit and was curious because she has history of coronary artery disease.  It looks like she has not been seen by Dr. Antoine Poche since 2023 and did have an abnormal exercise stress test at that time with frequent ectopy and he wanted her to have a cardiac CT but I do not see that she has had that done and has not been seen by cardiology since.  Please call her and let her know that we want her to be safe for sedation for the colonoscopy and therefore need to get her back into see Dr. Antoine Poche for further cardiology follow-up and workup prior to her having the colonoscopy.  Lets go ahead and cancel the colonoscopy that she has scheduled, and asked her to message you or call back and talk to you after she has undergone a cardiac workup and we can get her scheduled for colonoscopy.  It may be a good idea to get her an appointment with Dr. Chales Abrahams in the office just so we can make sure that she has had adequate clearance etc. prior to rescheduling  colonoscopy  Thank you

## 2023-04-02 NOTE — Telephone Encounter (Signed)
Called the patient about her upcoming hospital colonoscopy with Dr Chales Abrahams on 05/08/23. No answer. Left her a message to call me back.  Reason for call- I have not heard back from her cardiologist,  Dr Lorretta Harp  [address], [place] [address]  Phone: tel:[phone]  fax:[phone]   We will need to postpone her procedure date until she is cleared by cardiology to have this done.

## 2023-04-04 NOTE — Progress Notes (Signed)
 Agree with assessment/plan.  Edman Circle, MD [facility] [phone]

## 2023-04-08 ENCOUNTER — Other Ambulatory Visit: Payer: Self-pay

## 2023-04-08 NOTE — Telephone Encounter (Signed)
Re-faxed the clearance request to  Dr Lorretta Harp  [address]   [address], [place] [address]  Phone: tel:[phone]  fax:[phone]

## 2023-04-08 NOTE — Telephone Encounter (Signed)
Procedure canceled. Patient is not answering her telephone and does not have a voicemail. Cardiology is not responding to the request for clearance.

## 2023-04-21 NOTE — Telephone Encounter (Signed)
Called the patient. Today she answers her telephone. She states she has an appointment with her cardiologist. She agrees to ask for clearance and have the cardiologist fax a release to [place]. Advised once we have clearance we will reschedule her the colonoscopy.

## 2023-04-21 NOTE — Telephone Encounter (Signed)
Sheila Walker I cannot get a response form the patient or the cardiologist. I have canceled her procedure.

## 2023-04-22 NOTE — Telephone Encounter (Signed)
Thx, Please make sure she does not fall through the cracks RG

## 2023-04-24 ENCOUNTER — Telehealth: Payer: Self-pay

## 2023-04-24 ENCOUNTER — Other Ambulatory Visit: Payer: Self-pay

## 2023-04-24 NOTE — Telephone Encounter (Signed)
Spoke with the patient. Received signed clearance for her to have her colonoscopy. Copy sent to be scanned into her chart. Colonoscopy 06/18/23 arrive at 1:30 pm. New instructions mailed. Confirmed with the patient that she has her prep solution.

## 2023-04-26 IMAGING — CR DG SACRUM/COCCYX 2+V
3 series · 3 of 3 positions shown · non-contrast
Comparison: None.

CLINICAL DATA: Fall

EXAM:
SACRUM AND COCCYX - 2+ VIEW

[sacrum ap]
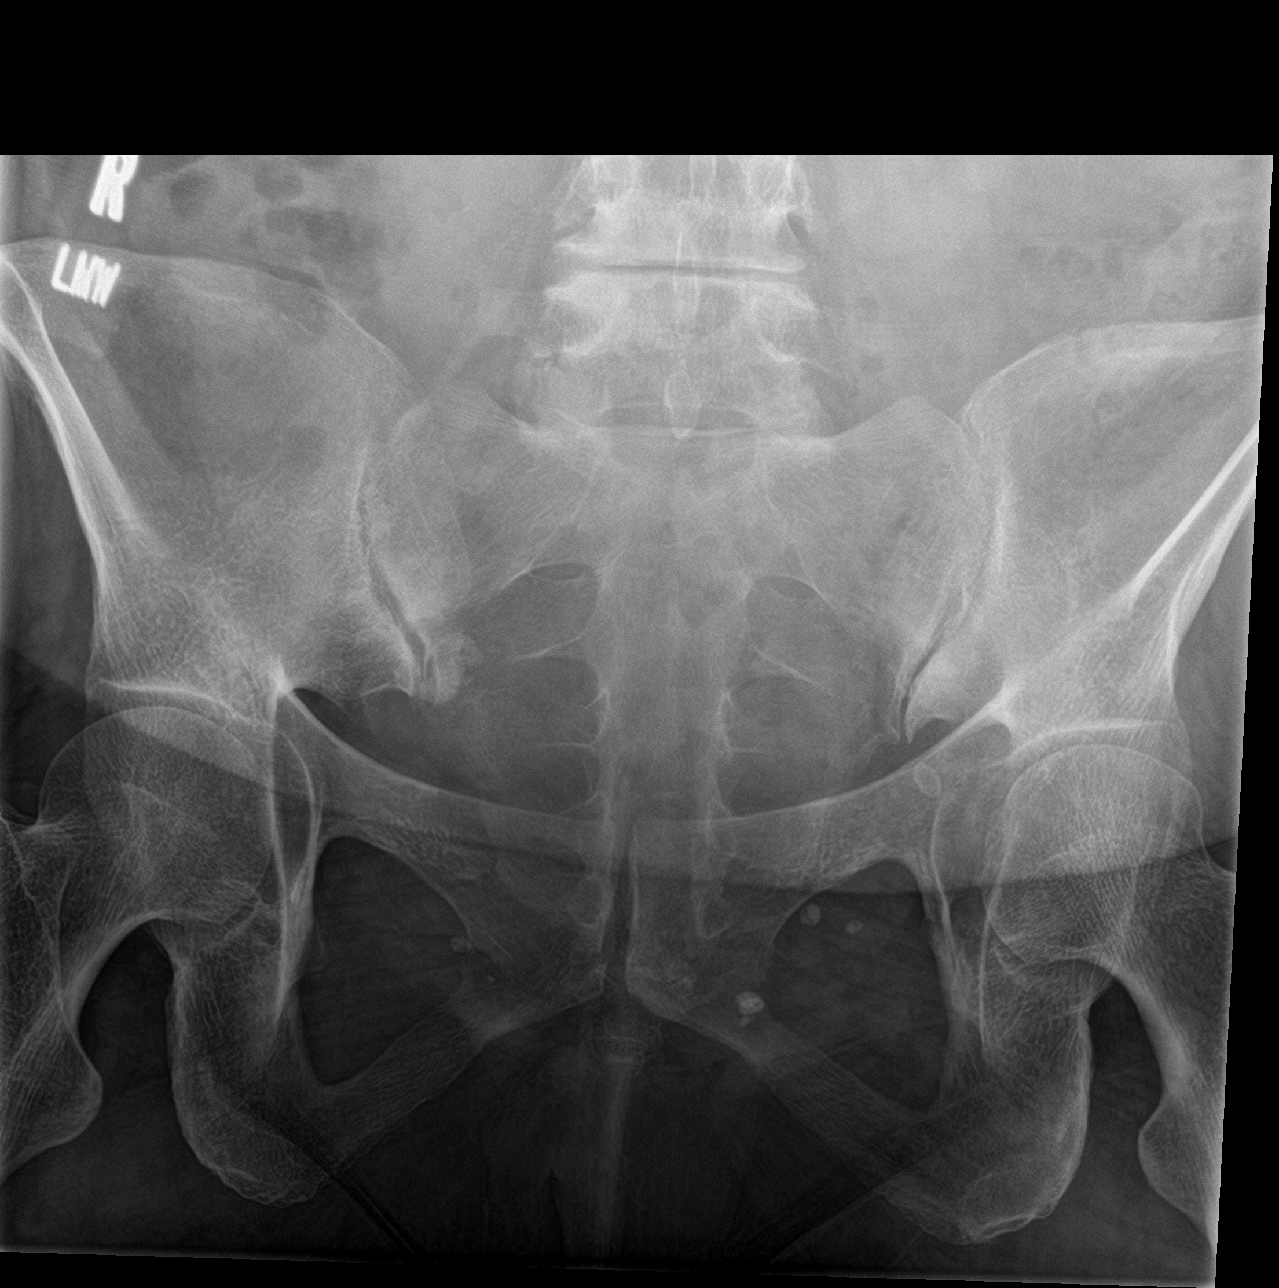

[coccyx ap]
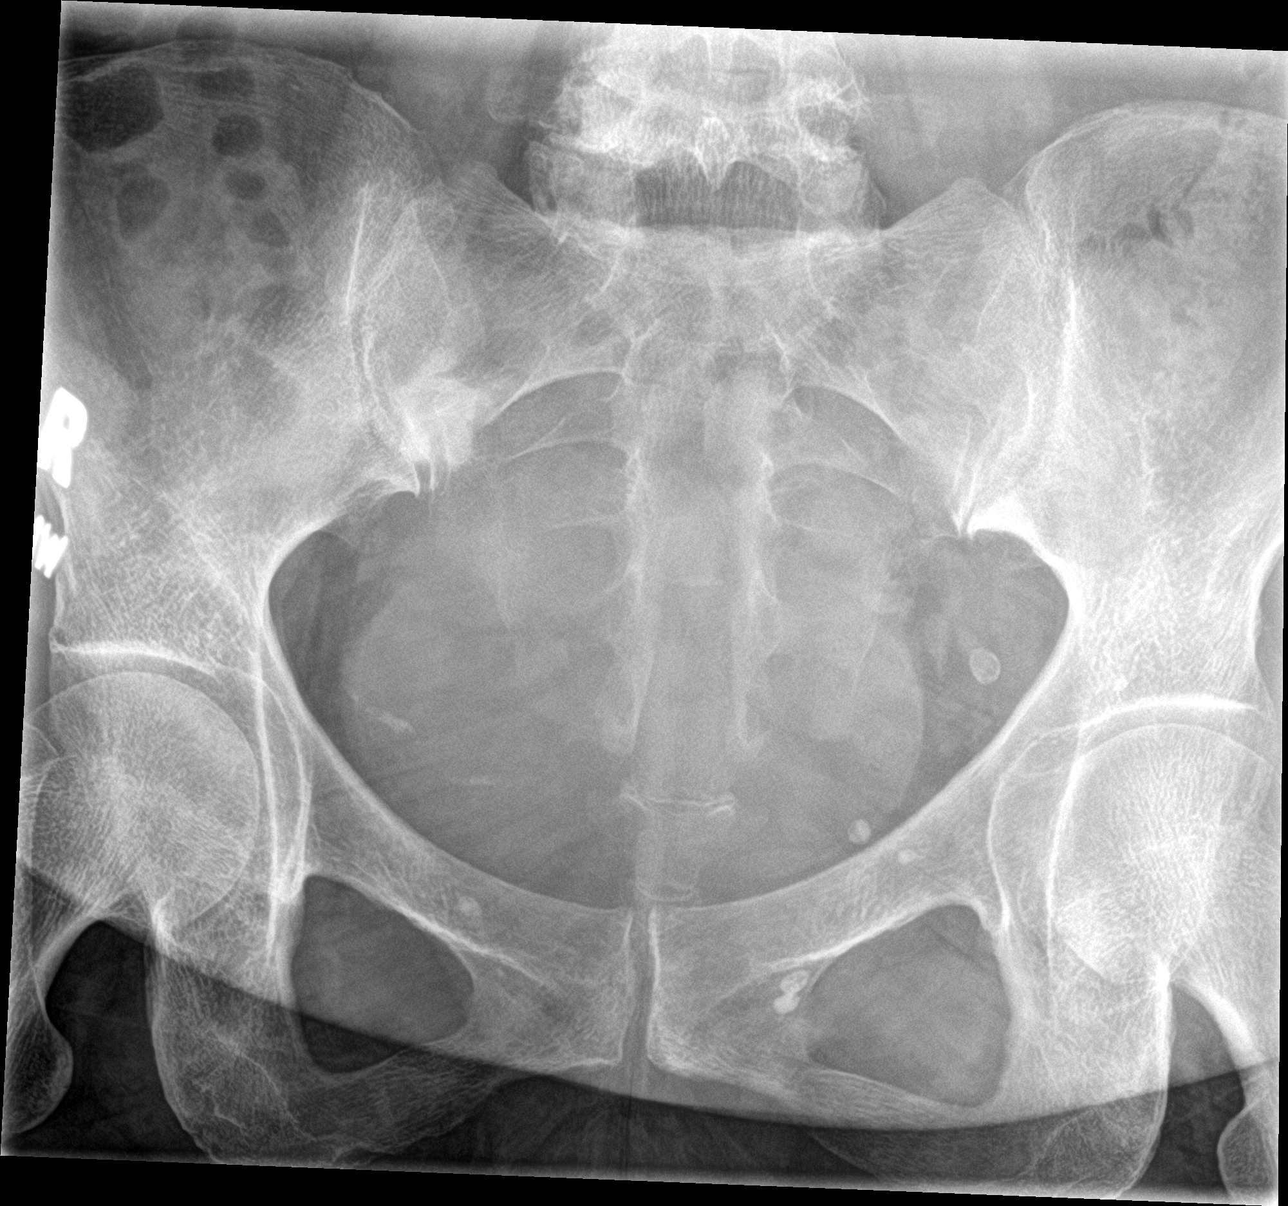

[sacrum lat]
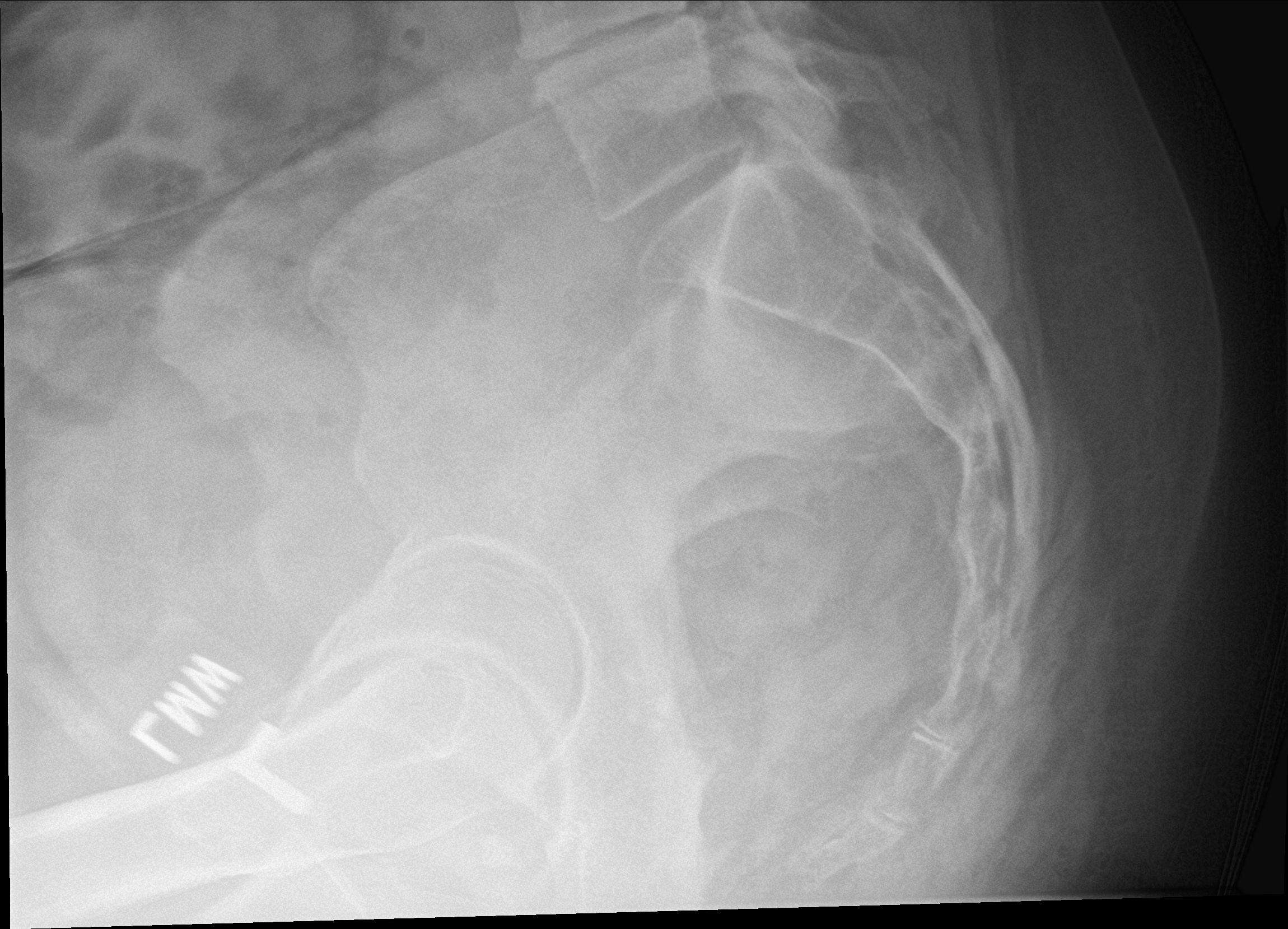

[3 of 3 positions shown; findings below may reference images not displayed]

FINDINGS: There is no evidence of fracture or other focal bone lesions.
IMPRESSION: Negative.

## 2023-04-26 IMAGING — CR DG LUMBAR SPINE 2-3V
3 series · 3 of 3 positions shown · non-contrast
Comparison: Lumbar spine series 01/21/2020

CLINICAL DATA: Fall injury with tailbone pain.

EXAM:
LUMBAR SPINE - 2-3 VIEW

[l-spine ap]
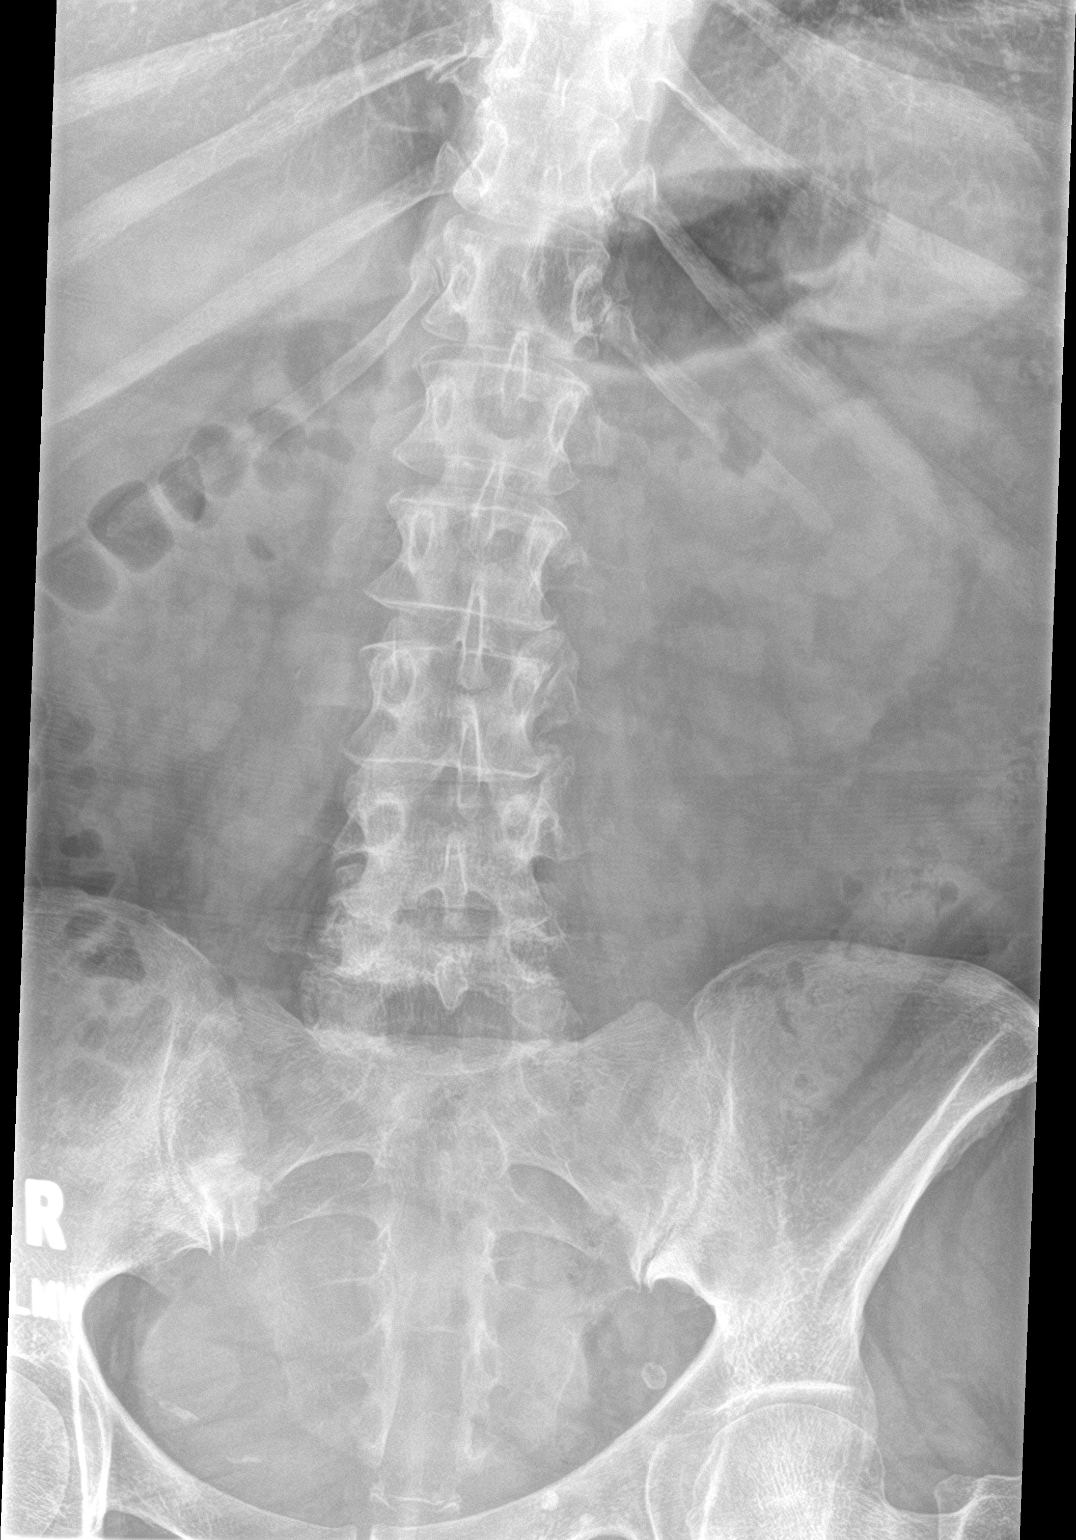

[l-spine lat]
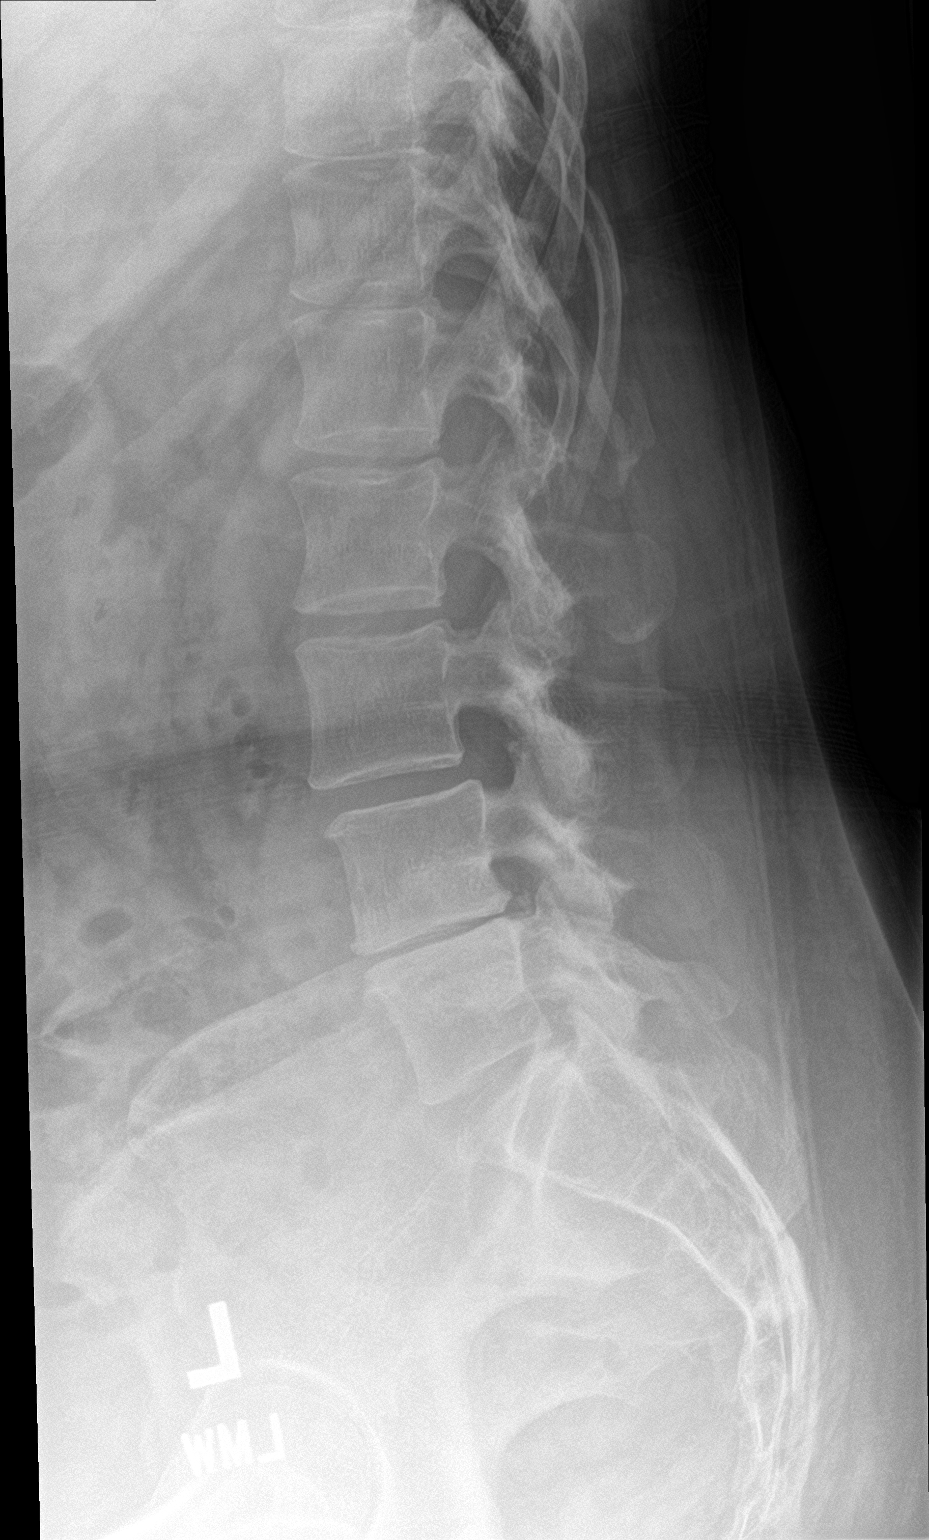

[l-spine spot]
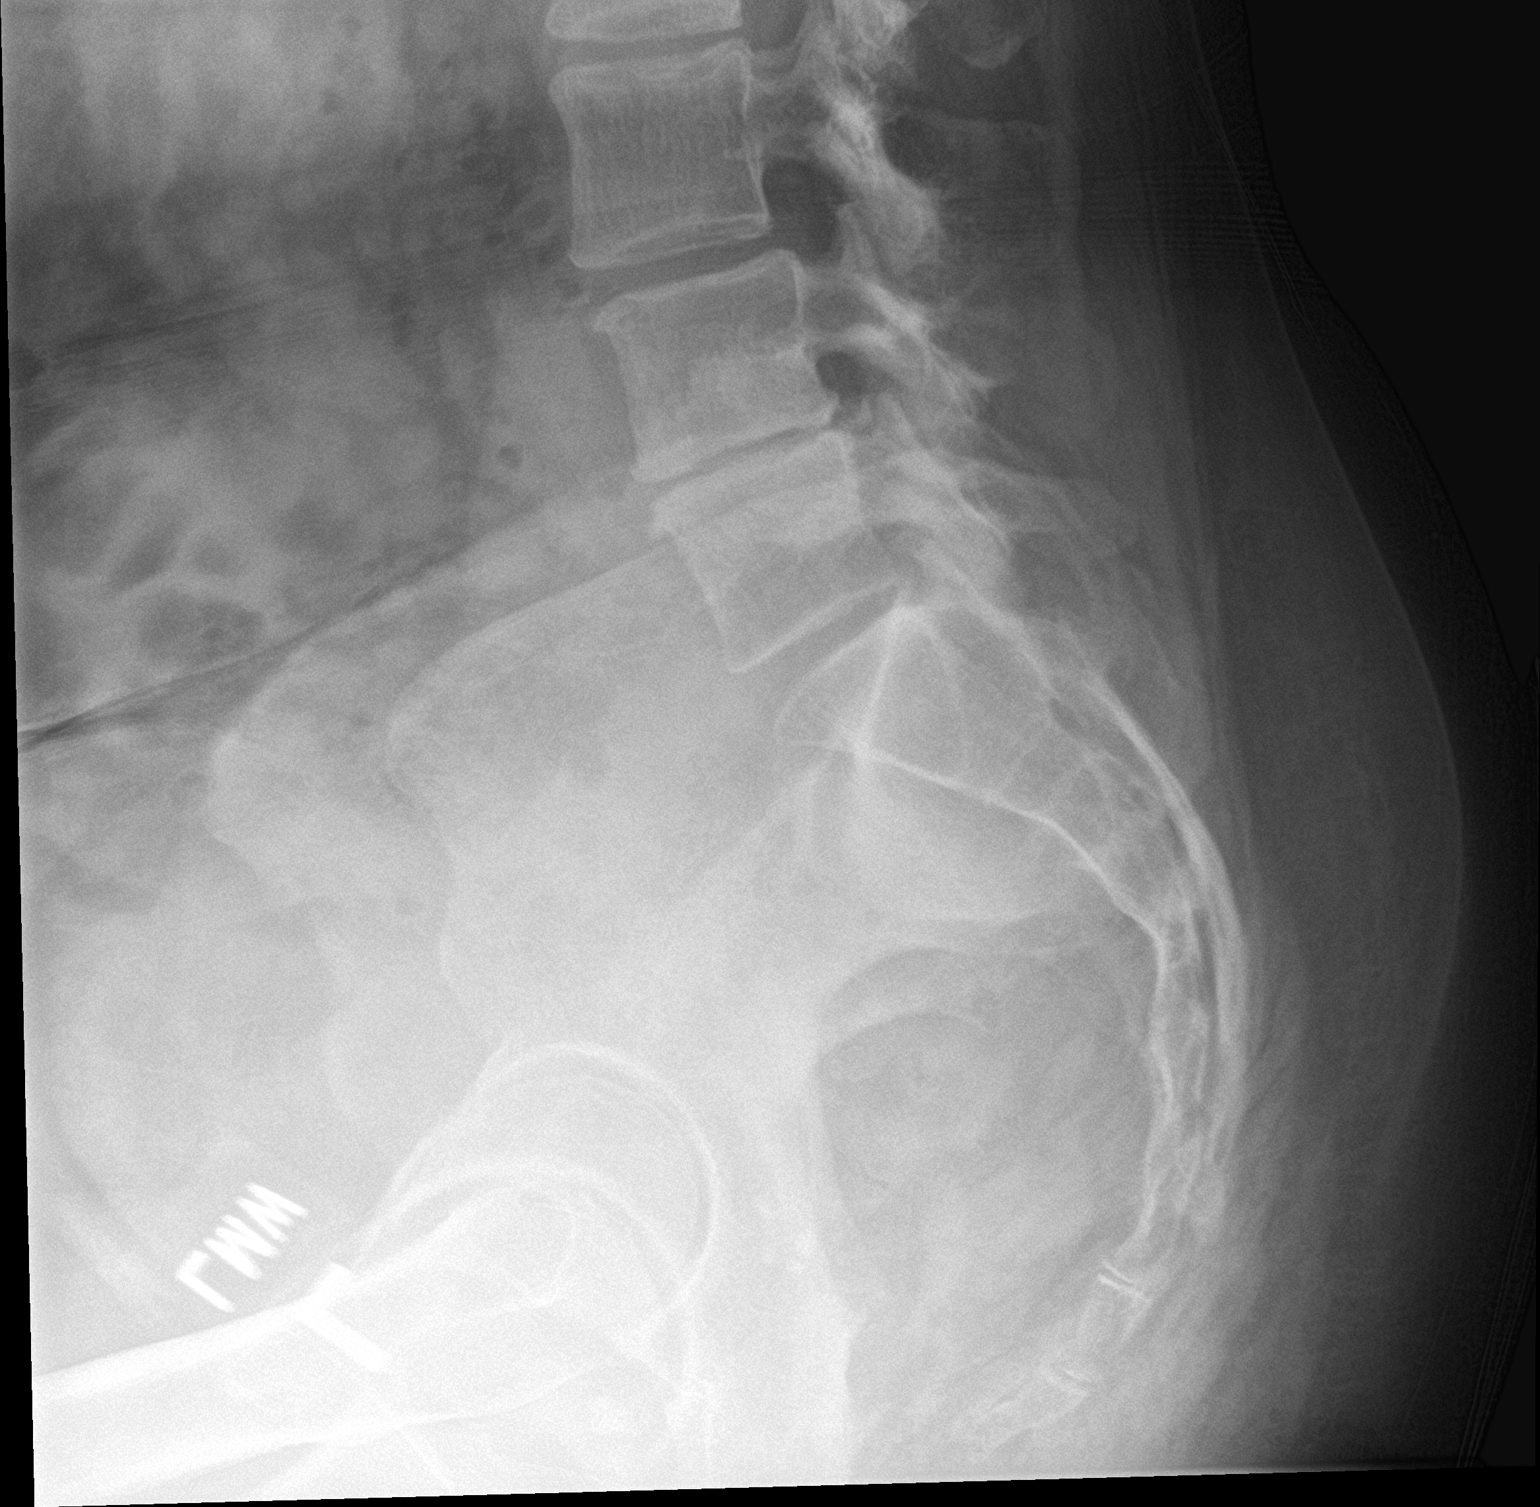

[3 of 3 positions shown; findings below may reference images not displayed]

FINDINGS: There is mild osteopenia with normal vertebral heights and without
evidence of fractures. Slight dextroscoliosis is again noted,
advanced chronic L4-5 disc collapse with small endplate osteophytes,
and mild disc space loss and small endplate osteophytes at L5-S1 and
L3-4.

The disc heights are normal above L3. There are moderate facet
hypertrophic changes L3-4 inferiorly and slight chronic grade 1
anterolisthesis again seen at L3-4.

There is spurring of the SI joints and mild features of symmetric
sacroiliitis, chronic as well. The visualized pelvis is intact.
IMPRESSION: Osteopenia, slight scoliosis, and degenerative changes described
above from L3-4 inferiorly. Evidence of fractures is not seen. No
appreciable interval changes from 01/21/2020.

## 2023-04-27 IMAGING — CR DG HIP (WITH OR WITHOUT PELVIS) 2-3V*R*
1 series · 3 of 3 positions shown · non-contrast
Comparison: CT of abdomen and pelvis and reconstructions of
12/30/2019

CLINICAL DATA: Right hip pain.  Fall injury.

EXAM:
DG HIP (WITH OR WITHOUT PELVIS) 2-3V RIGHT

[Series 1: dg hip unilat w or w/o pelvis 2-3 views  · non-contrast · 0.14mm/px · 3 of 3 slices shown]
[im 1/3]
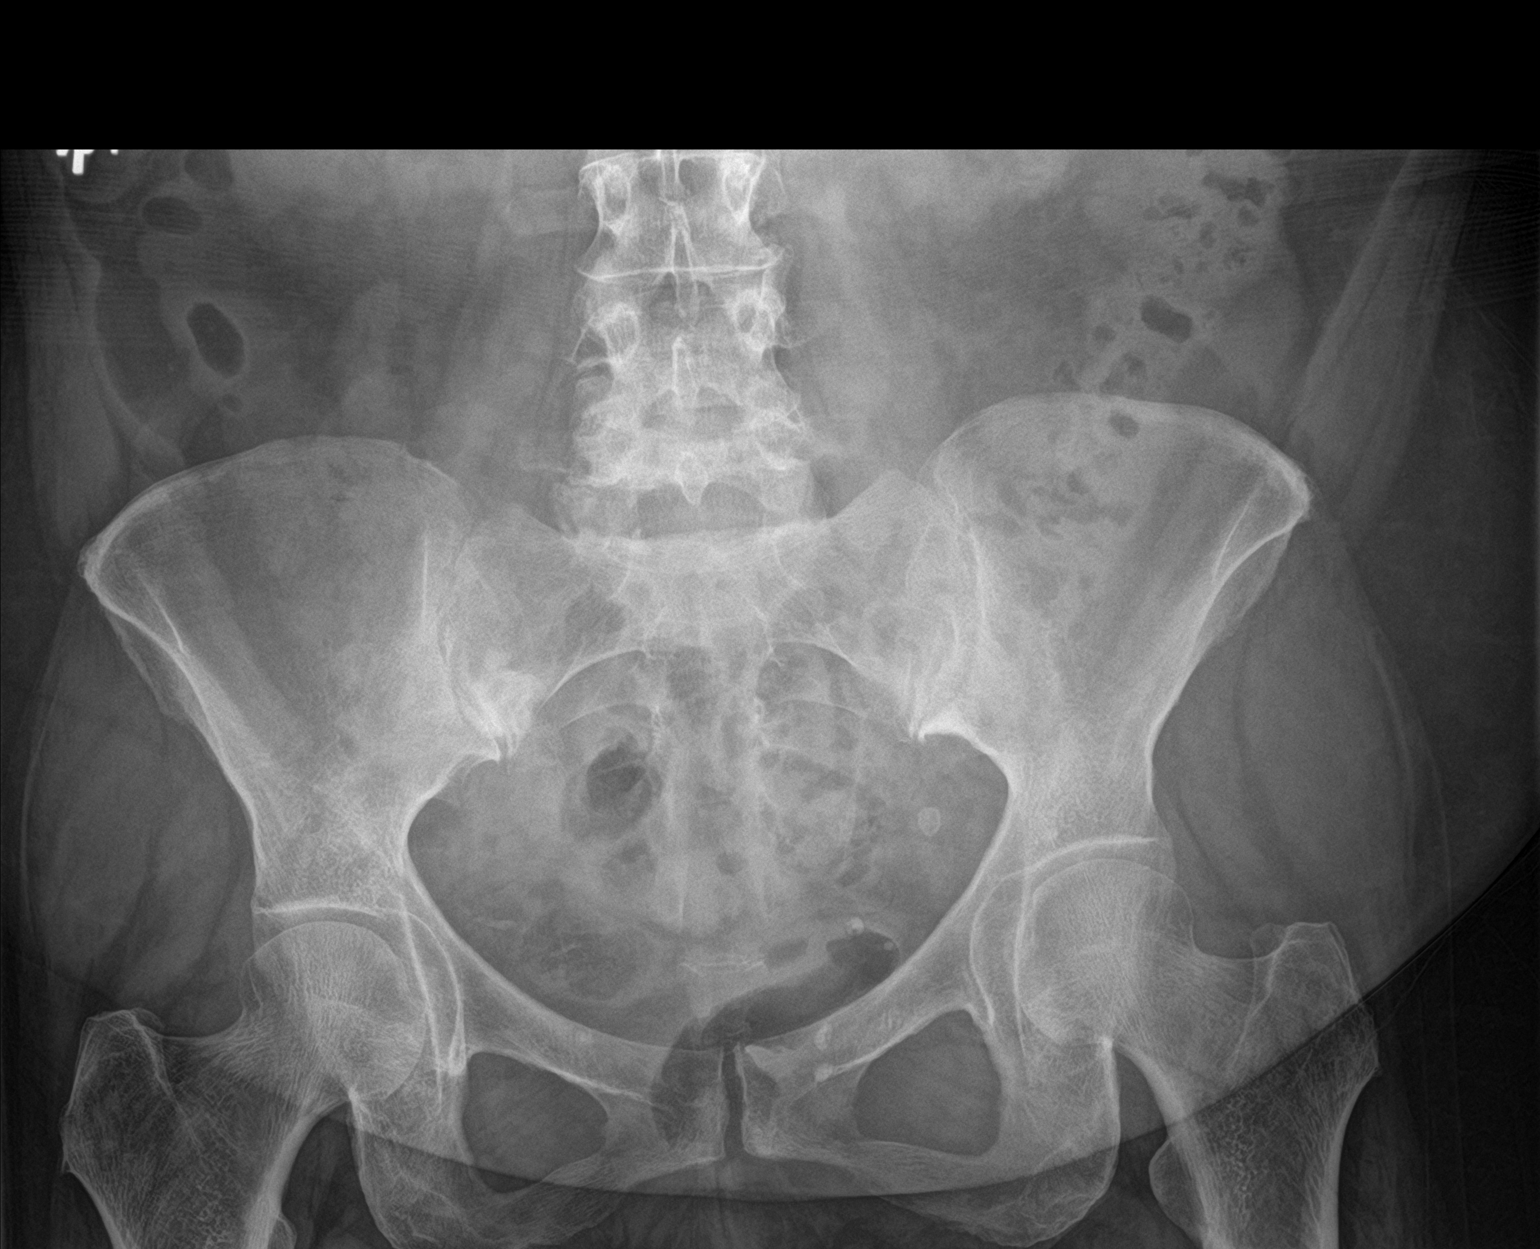
[im 2/3]
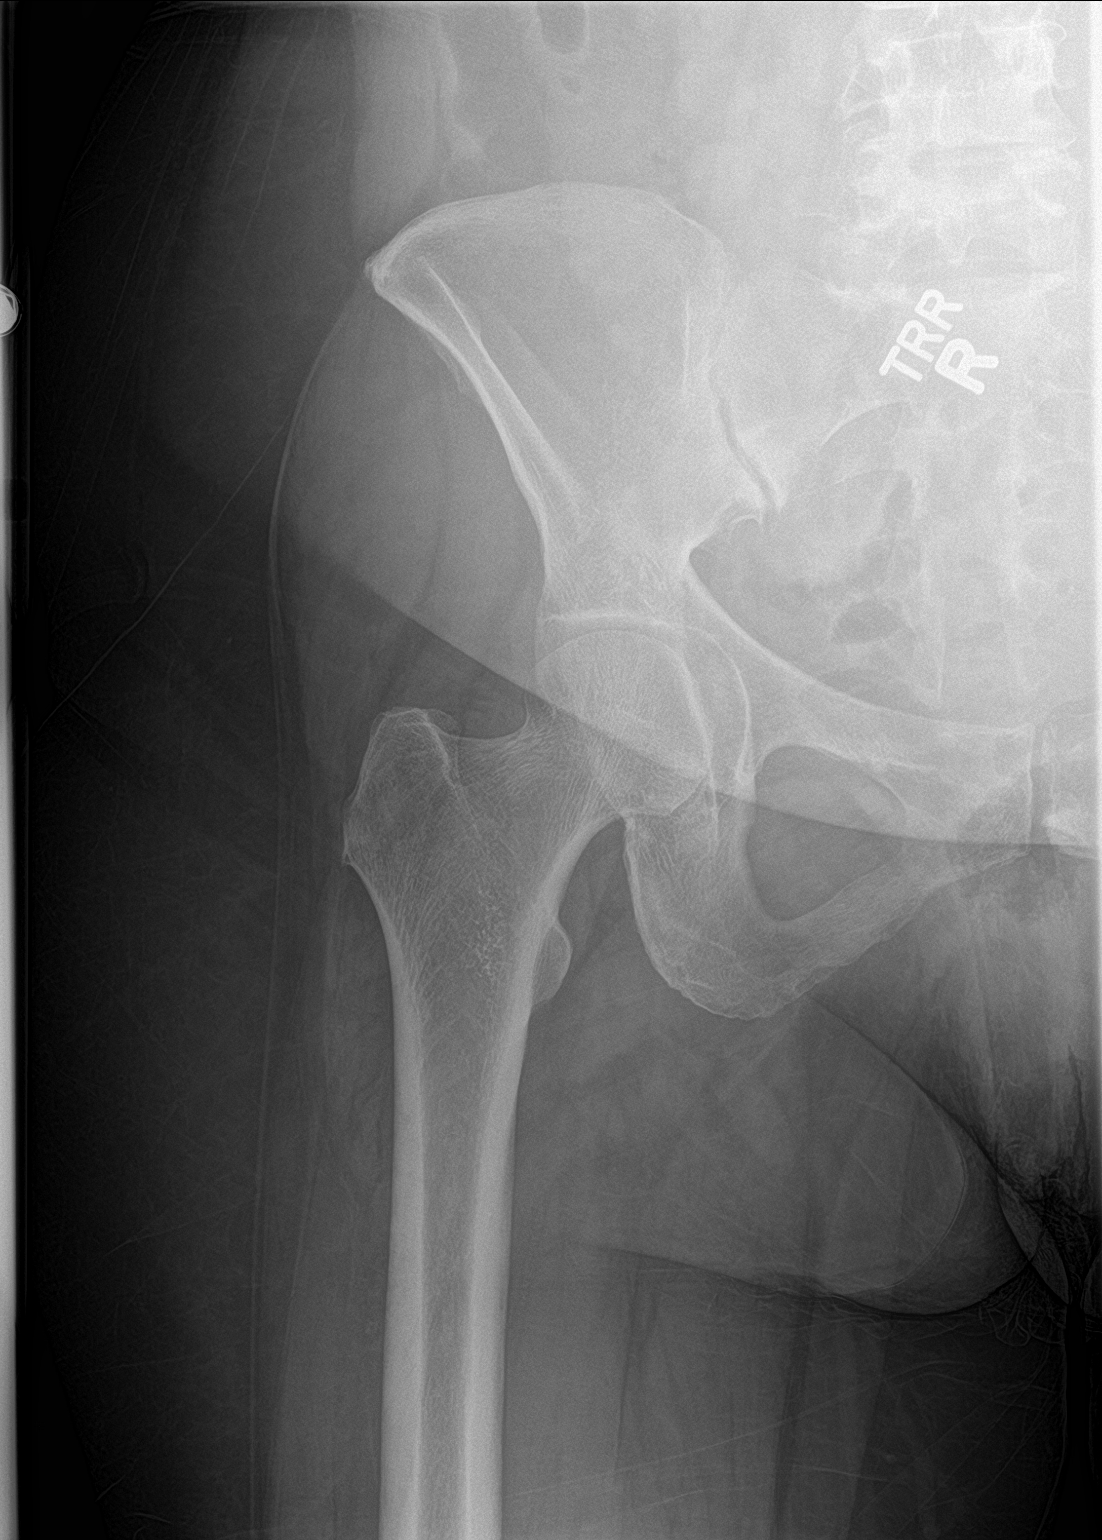
[im 3/3]
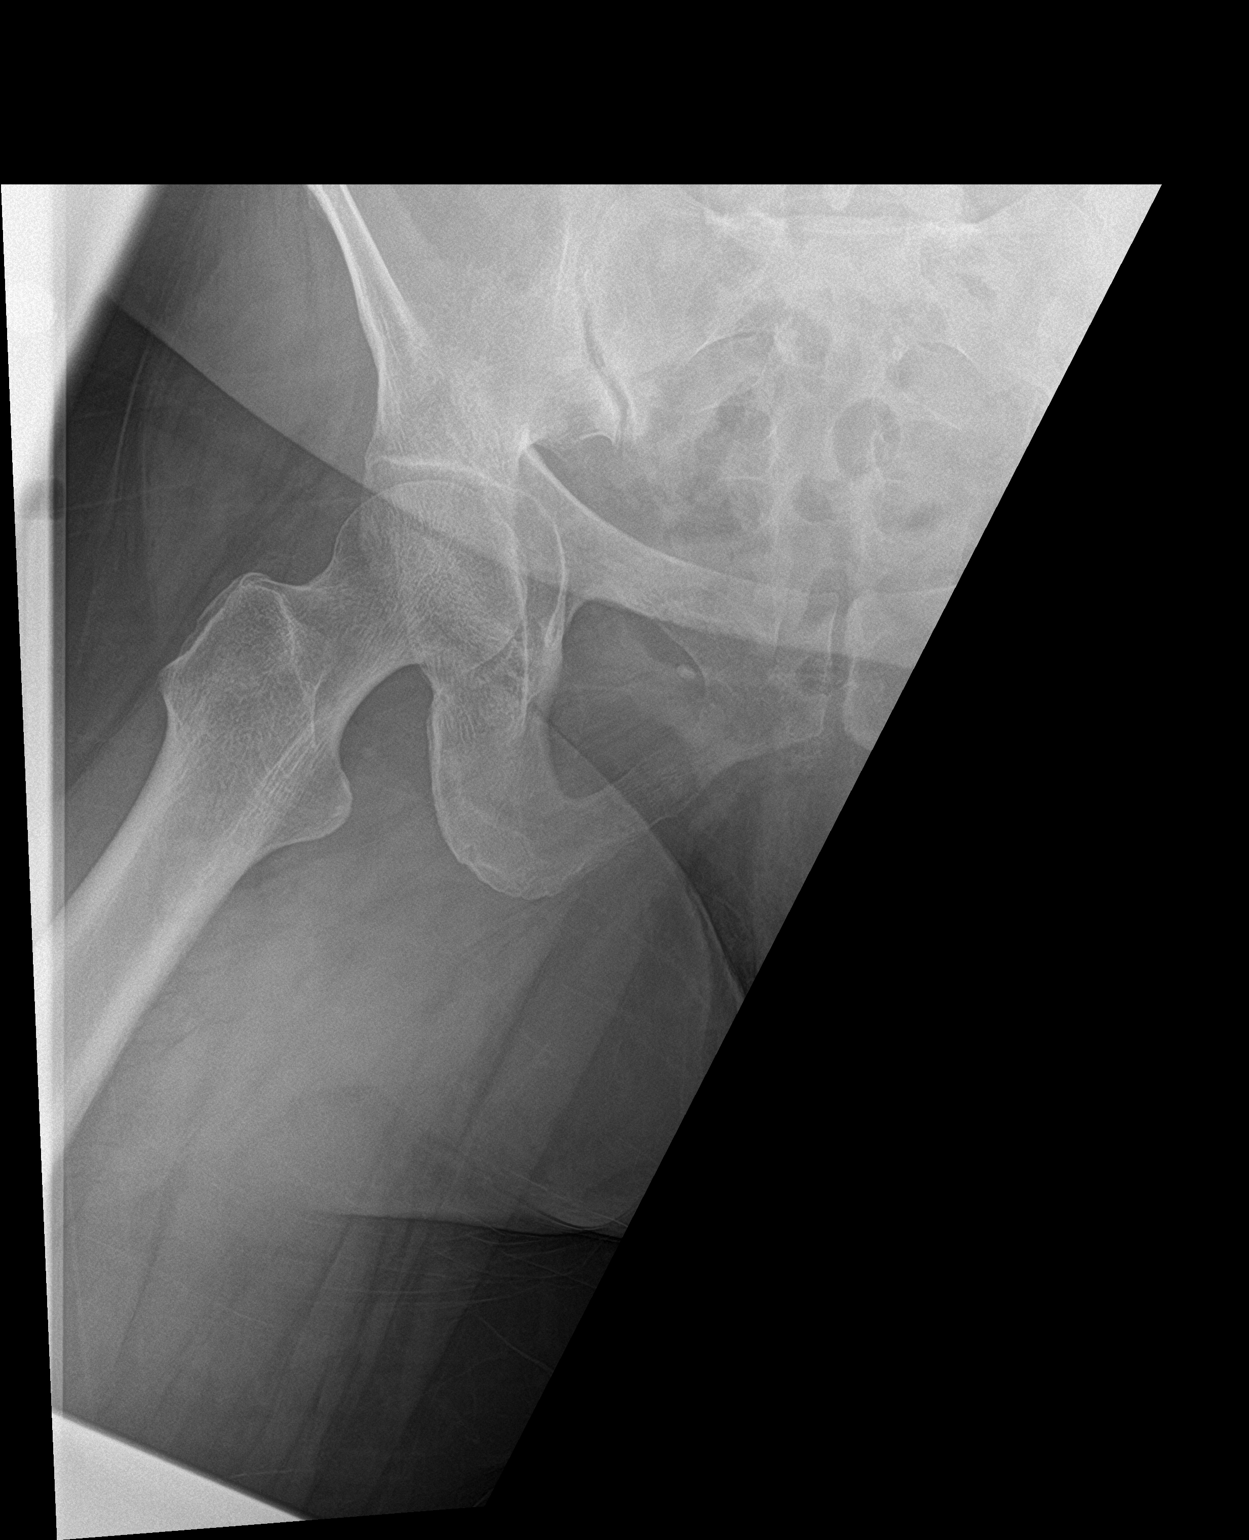

[3 of 3 positions shown; findings below may reference images not displayed]

FINDINGS: There is no evidence of hip fracture or dislocation. The hip joint
spaces are maintained. Narrowing and spurring of the SI joints is
again noted, bridging osteophytes across the lower right SI joint
and bilateral changes of symmetric chronic sacroiliitis.

There are stable phleboliths in the left hemipelvis. Soft tissues
are otherwise unremarkable.
IMPRESSION: No evidence of fractures or interval changes.

## 2023-04-28 NOTE — Telephone Encounter (Signed)
 See 04/24/23 telephone note indicating correct cardiac clearance and rescheduling of colonoscopy procedure to 06/2023 with Dr Chales Abrahams.

## 2023-05-08 ENCOUNTER — Encounter: Admitting: Gastroenterology

## 2023-06-10 ENCOUNTER — Encounter: Payer: Self-pay | Admitting: Gastroenterology

## 2023-06-11 NOTE — Telephone Encounter (Signed)
 Pt stated that she started have an aching pain in her left side about a week ago, Not tender to touch, No fever or chills, pain comes and go and is every couple days. Last BM yesterday. Soft. Pt stated that she was nauseated once yesterday with no vomiting. Pt does have a history of diverticulitis. Hospitalized in September.  Pt has upcoming colonoscopy on 06/18/2023: Please review and advise

## 2023-06-18 ENCOUNTER — Encounter: Payer: Self-pay | Admitting: Gastroenterology

## 2023-06-18 ENCOUNTER — Ambulatory Visit: Admitting: Gastroenterology

## 2023-06-18 VITALS — BP 138/91 | HR 69 | Temp 98.0°F | Resp 15 | Ht 67.0 in | Wt 210.0 lb

## 2023-06-18 DIAGNOSIS — K640 First degree hemorrhoids: Secondary | ICD-10-CM | POA: Diagnosis not present

## 2023-06-18 DIAGNOSIS — Z1211 Encounter for screening for malignant neoplasm of colon: Secondary | ICD-10-CM

## 2023-06-18 DIAGNOSIS — Z860101 Personal history of adenomatous and serrated colon polyps: Secondary | ICD-10-CM

## 2023-06-18 DIAGNOSIS — K5900 Constipation, unspecified: Secondary | ICD-10-CM

## 2023-06-18 DIAGNOSIS — D120 Benign neoplasm of cecum: Secondary | ICD-10-CM

## 2023-06-18 DIAGNOSIS — Z860100 Personal history of colon polyps, unspecified: Secondary | ICD-10-CM

## 2023-06-18 DIAGNOSIS — K5730 Diverticulosis of large intestine without perforation or abscess without bleeding: Secondary | ICD-10-CM | POA: Diagnosis not present

## 2023-06-18 MED ORDER — SODIUM CHLORIDE 0.9 % IV SOLN: 500.0000 mL | INTRAVENOUS | Status: DC

## 2023-06-18 NOTE — Patient Instructions (Addendum)
 Resume previous diet Continue regular medications  Maintain high fiber diet and drink plenty of water Use one teaspoon Benefiber daily, take one capful Miralax with 8 ounces water daily. Handouts given for polyps, hemorrhoids and diverticulosis  YOU HAD AN ENDOSCOPIC PROCEDURE TODAY AT THE [facility]:   Refer to the procedure report that was given to you for any specific questions about what was found during the examination.  If the procedure report does not answer your questions, please call your gastroenterologist to clarify.  If you requested that your care partner not be given the details of your procedure findings, then the procedure report has been included in a sealed envelope for you to review at your convenience later.  YOU SHOULD EXPECT: Some feelings of bloating in the abdomen. Passage of more gas than usual.  Walking can help get rid of the air that was put into your GI tract during the procedure and reduce the bloating. If you had a lower endoscopy (such as a colonoscopy or flexible sigmoidoscopy) you may notice spotting of blood in your stool or on the toilet paper. If you underwent a bowel prep for your procedure, you may not have a normal bowel movement for a few days.  Please Note:  You might notice some irritation and congestion in your nose or some drainage.  This is from the oxygen used during your procedure.  There is no need for concern and it should clear up in a day or so.  SYMPTOMS TO REPORT IMMEDIATELY: Following lower endoscopy (colonoscopy or flexible sigmoidoscopy):  Excessive amounts of blood in the stool  Significant tenderness or worsening of abdominal pains  Swelling of the abdomen that is new, acute  Fever of 100F or higher  For urgent or emergent issues, a gastroenterologist can be reached at any hour by calling [phone]. Do not use MyChart messaging for urgent concerns.   DIET:  We do recommend a small meal at first, but then you may  proceed to your regular diet.  Drink plenty of fluids but you should avoid alcoholic beverages for 24 hours.  ACTIVITY:  You should plan to take it easy for the rest of today and you should NOT DRIVE or use heavy machinery until tomorrow (because of the sedation medicines used during the test).    FOLLOW UP: Our staff will call the number listed on your records the next business day following your procedure.  We will call around 7:15- 8:00 am to check on you and address any questions or concerns that you may have regarding the information given to you following your procedure. If we do not reach you, we will leave a message.     If any biopsies were taken you will be contacted by phone or by letter within the next 1-3 weeks.  Please call us at [phone] if you have not heard about the biopsies in 3 weeks.   SIGNATURES/CONFIDENTIALITY: You and/or your care partner have signed paperwork which will be entered into your electronic medical record.  These signatures attest to the fact that that the information above on your After Visit Summary has been reviewed and is understood.  Full responsibility of the confidentiality of this discharge information lies with you and/or your care-partner.

## 2023-06-18 NOTE — Progress Notes (Signed)
 Pt's states no medical or surgical changes since previsit or office visit.

## 2023-06-18 NOTE — Progress Notes (Signed)
 [facility] History and Physical   Primary Care Physician:  Tacey Ruiz, FNP   Reason for Procedure:   History of polyps 2019  Plan:    colon     HPI: Sheila Walker is a 53 y.o. female    Past Medical History:  Diagnosis Date   Allergy    Anxiety    Arthritis    Cancer of kidney (HCC)    Hyperlipidemia    Hypertension    Palpitations    Sleep apnea    CPAP    Past Surgical History:  Procedure Laterality Date   COLONOSCOPY  3 years ago    W/Dr.Lakechia Nay   EXCISION OF TONGUE LESION N/A 10/15/2019   Procedure: EXCISION OF EPIGLOTTIC;  Surgeon: Linus Salmons, MD;  Location: [facility];  Service: ENT;  Laterality: N/A;   MICROLARYNGOSCOPY N/A 10/15/2019   Procedure: MICROLARYNGOSCOPY with excision of epiglottic cyst;  Surgeon: Linus Salmons, MD;  Location: [facility];  Service: ENT;  Laterality: N/A;   PARTIAL NEPHRECTOMY     Left   WRIST FRACTURE SURGERY      Prior to Admission medications   Medication Sig Start Date End Date Taking? Authorizing Provider  aspirin EC 81 MG tablet Take 81 mg by mouth every evening. Swallow whole.   Yes [provider]  atorvastatin (LIPITOR) 20 MG tablet Take 20 mg by mouth every evening.   Yes [provider]  buPROPion (WELLBUTRIN SR) 150 MG 12 hr tablet Take 300 mg by mouth at bedtime. 04/18/21  Yes [provider]  hydrOXYzine (ATARAX) 25 MG tablet Take 50 mg by mouth at bedtime. 09/15/20  Yes [provider]  metoprolol succinate (TOPROL-XL) 25 MG 24 hr tablet TAKE 1 TABLET(25 MG) BY MOUTH IN THE MORNING AND AT BEDTIME Patient taking differently: Take 25 mg by mouth every evening. 09/28/21  Yes Rollene Rotunda, MD  NIFEdipine (PROCARDIA-XL/NIFEDICAL-XL) 30 MG 24 hr tablet Take 30 mg by mouth every evening. 07/30/19  Yes [provider]  Omega-3 Fatty Acids (FISH OIL) 300 MG CAPS Take 1 capsule by mouth every evening.   Yes [provider]  omeprazole (PRILOSEC) 40 MG capsule Take 40 mg by mouth daily.   Yes [provider]  OVER THE COUNTER MEDICATION Pt states she is taking a multivitamin that contain iron and magnesium   Yes [provider]  traZODone (DESYREL) 50 MG tablet Take 50 mg by mouth at bedtime. 03/21/22  Yes [provider]  cyclobenzaprine (FLEXERIL) 5 MG tablet TAKE 1 TABLET(5 MG) BY MOUTH THREE TIMES DAILY AS NEEDED FOR MUSCLE SPASMS Patient not taking: Reported on 03/17/2023 06/14/21   [provider]  magnesium 30 MG tablet Take 30 mg by mouth every evening. Patient not taking: Reported on 03/17/2023    [provider]  Melatonin 3 MG TBDP Take 1 tablet by mouth at bedtime as needed. 08/14/16   [provider]  meloxicam (MOBIC) 7.5 MG tablet Take 7.5 mg by mouth every evening. 04/20/21   [provider]  oxyCODONE-acetaminophen (PERCOCET/ROXICET) 5-325 MG tablet Take 1 tablet by mouth every 6 (six) hours as needed for severe pain. Patient not taking: Reported on 06/18/2023 11/27/22   Osvaldo Shipper, MD  polyethylene glycol (MIRALAX / GLYCOLAX) 17 g packet Take 17 g by mouth 2 (two) times daily. Patient not taking: Reported on 06/18/2023 11/27/22   Osvaldo Shipper, MD  senna-docusate (SENOKOT-S) 8.6-50 MG tablet Take 2 tablets by mouth 2 (two) times  daily. Patient not taking: Reported on 06/18/2023 11/27/22   Krishnan, Gokul, MD    Current Outpatient Medications  Medication Sig Dispense Refill   aspirin EC 81 MG tablet Take 81 mg by mouth every evening. Swallow whole.     atorvastatin (LIPITOR) 20 MG tablet Take 20 mg by mouth every evening.     buPROPion (WELLBUTRIN SR) 150 MG 12 hr tablet Take 300 mg by mouth at bedtime.     hydrOXYzine (ATARAX) 25 MG tablet Take 50 mg by mouth at bedtime.     metoprolol succinate (TOPROL-XL) 25 MG 24 hr tablet TAKE 1 TABLET(25 MG) BY MOUTH IN THE MORNING AND AT BEDTIME (Patient taking differently: Take 25 mg by mouth  every evening.) 180 tablet 3   NIFEdipine (PROCARDIA-XL/NIFEDICAL-XL) 30 MG 24 hr tablet Take 30 mg by mouth every evening.     Omega-3 Fatty Acids (FISH OIL) 300 MG CAPS Take 1 capsule by mouth every evening.     omeprazole (PRILOSEC) 40 MG capsule Take 40 mg by mouth daily.     OVER THE COUNTER MEDICATION Pt states she is taking a multivitamin that contain iron and magnesium     traZODone (DESYREL) 50 MG tablet Take 50 mg by mouth at bedtime.     cyclobenzaprine (FLEXERIL) 5 MG tablet TAKE 1 TABLET(5 MG) BY MOUTH THREE TIMES DAILY AS NEEDED FOR MUSCLE SPASMS (Patient not taking: Reported on 03/17/2023)     magnesium 30 MG tablet Take 30 mg by mouth every evening. (Patient not taking: Reported on 03/17/2023)     Melatonin 3 MG TBDP Take 1 tablet by mouth at bedtime as needed.     meloxicam (MOBIC) 7.5 MG tablet Take 7.5 mg by mouth every evening.     oxyCODONE-acetaminophen (PERCOCET/ROXICET) 5-325 MG tablet Take 1 tablet by mouth every 6 (six) hours as needed for severe pain. (Patient not taking: Reported on 06/18/2023) 20 tablet 0   polyethylene glycol (MIRALAX / GLYCOLAX) 17 g packet Take 17 g by mouth 2 (two) times daily. (Patient not taking: Reported on 06/18/2023) 60 each 0   senna-docusate (SENOKOT-S) 8.6-50 MG tablet Take 2 tablets by mouth 2 (two) times daily. (Patient not taking: Reported on 06/18/2023) 60 tablet 0   Current Facility-Administered Medications  Medication Dose Route Frequency Provider Last Rate Last Admin   0.9 %  sodium chloride infusion  500 mL Intravenous Continuous Lajuan Pila, MD        Allergies as of 06/18/2023 - Review Complete 06/18/2023  Allergen Reaction Noted   Amlodipine Swelling 10/20/2015   Azithromycin Hives 07/16/2012   Lisinopril Anaphylaxis 05/31/2015   Etodolac Rash 07/07/2013   Isosorbide nitrate Other (See Comments) 10/30/2016    Family History  Problem Relation Age of Onset   Colon cancer Maternal Uncle 60   Colon cancer Maternal  Grandfather 60   Esophageal cancer Neg Hx    Stomach cancer Neg Hx     Social History   Socioeconomic History   Marital status: Widowed    Spouse name: Not on file   Number of children: 0   Years of education: Not on file   Highest education level: Not on file  Occupational History   Occupation: disable  Tobacco Use   Smoking status: Every Day    Current packs/day: 0.75    Types: Cigarettes   Smokeless tobacco: Never  Vaping Use   Vaping status: Never Used  Substance and Sexual Activity   Alcohol use: No   Drug use:  Yes    Types: Marijuana    Comment: 1-2 times per week per pt, last smoke 06/16/23   Sexual activity: Not on file  Other Topics Concern   Not on file  Social History Narrative   Works.  Lives with partner.     Social Drivers of Corporate investment banker Strain: Low Risk  (01/18/2020)   Received from [facility], [facility]   Overall Financial Resource Strain (CARDIA)    Difficulty of Paying Living Expenses: Not very hard  Food Insecurity: No Food Insecurity (11/23/2022)   Hunger Vital Sign    Worried About Running Out of Food in the Last Year: Never true    Ran Out of Food in the Last Year: Never true  Recent Concern: Food Insecurity - Food Insecurity Present (10/25/2022)   Received from [facility]   Hunger Vital Sign    Worried About Running Out of Food in the Last Year: Never true    Ran Out of Food in the Last Year: Sometimes true  Transportation Needs: No Transportation Needs (11/23/2022)   PRAPARE - Administrator, Civil Service (Medical): No    Lack of Transportation (Non-Medical): No  Physical Activity: Unknown (01/18/2020)   Received from [facility], [facility]   Exercise Vital Sign    Days of Exercise per Week: 0 days    Minutes of Exercise per Session: Not on file  Stress: Stress Concern Present (01/18/2020)   Received from [facility], [facility] -  Occupational Stress Questionnaire    Feeling of Stress : Rather much  Social Connections: Unknown (01/18/2020)   Received from [facility], [facility]   Social Connection and Isolation Panel [NHANES]    Frequency of Communication with Friends and Family: More than three times a week    Frequency of Social Gatherings with Friends and Family: More than three times a week    Attends Religious Services: Not on file    Active Member of Clubs or Organizations: Not on file    Attends Banker Meetings: Not on file    Marital Status: Not on file  Intimate Partner Violence: Not At Risk (11/23/2022)   Humiliation, Afraid, Rape, and Kick questionnaire    Fear of Current or Ex-Partner: No    Emotionally Abused: No    Physically Abused: No    Sexually Abused: No    Review of Systems: Positive for none All other review of systems negative except as mentioned in the HPI.  Physical Exam: Vital signs in last 24 hours: @VSRANGES @   General:   Alert,  Well-developed, well-nourished, pleasant and cooperative in NAD Lungs:  Clear throughout to auscultation.   Heart:  Regular rate and rhythm; no murmurs, clicks, rubs,  or gallops. Abdomen:  Soft, nontender and nondistended. Normal bowel sounds.   Neuro/Psych:  Alert and cooperative. Normal mood and affect. A and O x 3    No significant changes were identified.  The patient continues to be an appropriate candidate for the planned procedure and anesthesia.   Magnus Schuller, MD. [facility] 06/18/2023 3:08 PM@

## 2023-06-18 NOTE — Progress Notes (Signed)
 A/o x 3, VSS, gd SR's, pleased with anesthesia, report to RN

## 2023-06-18 NOTE — Progress Notes (Signed)
 Called to room to assist during endoscopic procedure.  Patient ID and intended procedure confirmed with present staff. Received instructions for my participation in the procedure from the performing physician.

## 2023-06-18 NOTE — Op Note (Signed)
 [facility] Patient Name: Sheila Walker Procedure Date: 06/18/2023 3:07 PM MRN: [id number] Endoscopist: Lynann Bologna , MD, [id number] Age: 53 Referring MD:  Date of Birth: 1970-04-22 Gender: Female Account #: [id number] Procedure:                Colonoscopy Indications:              High risk colon cancer surveillance: Personal                            history of colonic polyps 2019. H/O sigmoid                            diverticulitis 11/2022 Medicines:                Monitored Anesthesia Care Procedure:                Pre-Anesthesia Assessment:                           - Prior to the procedure, a History and Physical                            was performed, and patient medications and                            allergies were reviewed. The patient's tolerance of                            previous anesthesia was also reviewed. The risks                            and benefits of the procedure and the sedation                            options and risks were discussed with the patient.                            All questions were answered, and informed consent                            was obtained. Prior Anticoagulants: The patient has                            taken no anticoagulant or antiplatelet agents. ASA                            Grade Assessment: II - A patient with mild systemic                            disease. After reviewing the risks and benefits,                            the patient was deemed in satisfactory condition to  undergo the procedure.                           After obtaining informed consent, the colonoscope                            was passed under direct vision. Throughout the                            procedure, the patient's blood pressure, pulse, and                            oxygen saturations were monitored continuously. The                            CF [id number] #[id number] was introduced through the  anus                            and advanced to the 2 cm into the ileum. The                            colonoscopy was performed without difficulty. The                            patient tolerated the procedure well. The quality                            of the bowel preparation was good. The terminal                            ileum, ileocecal valve, appendiceal orifice, and                            rectum were photographed. Scope In: 3:21:44 PM Scope Out: 3:35:27 PM Scope Withdrawal Time: 0 hours 10 minutes 31 seconds  Total Procedure Duration: 0 hours 13 minutes 43 seconds  Findings:                 A 4 mm polyp was found in the proximal ascending                            colon. The polyp was sessile. The polyp was removed                            with a cold snare. Resection and retrieval were                            complete.                           Multiple medium-mouthed diverticula were found in                            the sigmoid colon, several in descending colon,  transverse colon, few in ascending colon and 1 in                            cecum. Several diverticula with stool impacted.                            These were most severe in the sigmoid colon giving                            it a "Swiss cheese appearance". There is no                            significant luminal narrowing.                           Non-bleeding internal hemorrhoids were found during                            retroflexion. The hemorrhoids were small and Grade                            I (internal hemorrhoids that do not prolapse).                           The terminal ileum appeared normal.                           The exam was otherwise without abnormality on                            direct and retroflexion views. Complications:            No immediate complications. Estimated Blood Loss:     Estimated blood loss: none. Impression:                - One 4 mm polyp in the proximal ascending colon,                            removed with a cold snare. Resected and retrieved.                           - Pancolonic diverticulosis predominantly in the                            sigmoid colon.                           - Non-bleeding internal hemorrhoids.                           - The examined portion of the ileum was normal.                           - The examination was otherwise normal on direct  and retroflexion views. Recommendation:           - Patient has a contact number available for                            emergencies. The signs and symptoms of potential                            delayed complications were discussed with the                            patient. Return to normal activities tomorrow.                            Written discharge instructions were provided to the                            patient.                           - Resume previous diet.                           - Use Benefiber one teaspoon PO daily daily.                           - Miralax 1 capful (17 grams) in 8 ounces of water                            PO daily.                           - Await pathology results.                           - Repeat colonoscopy for surveillance based on                            pathology results.                           - The findings and recommendations were discussed                            with the patient's family. Lajuan Pila, MD 06/18/2023 3:42:13 PM This report has been signed electronically.

## 2023-06-19 ENCOUNTER — Telehealth: Payer: Self-pay

## 2023-06-19 NOTE — Telephone Encounter (Signed)
 Discussed in detail at the time of colonoscopy RG

## 2023-06-19 NOTE — Telephone Encounter (Signed)
 Post procedure follow up call, no answer

## 2023-06-25 LAB — SURGICAL PATHOLOGY

## 2023-06-29 ENCOUNTER — Encounter: Payer: Self-pay | Admitting: Gastroenterology
